# Patient Record
Sex: Female | Born: 1990 | Race: White | Hispanic: No | Marital: Single | Smoking: Never smoker
Health system: Southern US, Community
[De-identification: ages and names within clinical notes are randomized; demographics above are authoritative.]

## PROBLEM LIST (undated history)

## (undated) DIAGNOSIS — M1990 Unspecified osteoarthritis, unspecified site: Secondary | ICD-10-CM

## (undated) HISTORY — DX: Unspecified osteoarthritis, unspecified site: M19.90

---

## 2013-11-30 ENCOUNTER — Ambulatory Visit (INDEPENDENT_AMBULATORY_CARE_PROVIDER_SITE_OTHER): Payer: Managed Care, Other (non HMO) | Admitting: Family Medicine

## 2013-11-30 VITALS — BP 118/70 | HR 82 | Temp 98.2°F | Resp 16 | Ht 68.25 in | Wt 134.8 lb

## 2013-11-30 DIAGNOSIS — M4120 Other idiopathic scoliosis, site unspecified: Secondary | ICD-10-CM

## 2013-11-30 DIAGNOSIS — M419 Scoliosis, unspecified: Secondary | ICD-10-CM

## 2013-11-30 DIAGNOSIS — M539 Dorsopathy, unspecified: Secondary | ICD-10-CM

## 2013-11-30 DIAGNOSIS — M549 Dorsalgia, unspecified: Secondary | ICD-10-CM

## 2013-11-30 NOTE — Patient Instructions (Signed)
Scoliosis  Scoliosis is the name given to a spine that curves sideways. Scoliosis can cause twisting of your shoulders, hips, chest, back, and rib cage.   CAUSES   The cause of scoliosis is not always known. It may be caused by a birth defect or by a disease that can cause muscular dysfunction and imbalance, such as cerebral palsy and muscular dystrophy.   RISK FACTORS  Having a disease that causes muscle disease or dysfunction.  SIGNS AND SYMPTOMS  Scoliosis often has no signs or symptoms. If they are present, they may include:  · Unequal size of one body side compared to the other (asymmetry).  · Visible curvature of the spine.  · Pain. The pain may limit physical activity.  · Shortness of breath.  · Bowel or bladder issues.  DIAGNOSIS  A skilled health care provider will perform an evaluation. This will involve:  · Taking your history.  · Performing a physical examination.  · Performing a neurological exam to detect nerve or muscle function loss.  · Range of motion studies on the spine.  · X-rays.  An MRI may also be obtained.  TREATMENT   Treatment varies depending on the nature, extent, and severity of the disease. If the curvature is not great, you may need only observation. A brace may be used to prevent scoliosis from progressing. A brace may also be needed during growth spurts. Physical therapy may be of benefit. Surgery may be required.   HOME CARE INSTRUCTIONS   · Your health care provider may suggest exercises to strengthen your muscles. Perform them as directed.  · Ask your health care provider before participating in any sports.    · If you have been prescribed an orthopedic brace, wear it as instructed by your health care provider.   SEEK MEDICAL CARE IF:  Your brace causes the skin to become sore (chafe) or is uncomfortable.   SEEK IMMEDIATE MEDICAL CARE IF:  · You have back pain that is not relieved by the medicines prescribed by your health care provider.    · Your legs feel weak or you lose  function in your legs.  · You lose some bowel or bladder control.    Document Released: 03/14/2000 Document Revised: 03/22/2013 Document Reviewed: 11/22/2012  ExitCare® Patient Information ©2015 [employer]. This information is not intended to replace advice given to you by your health care provider. Make sure you discuss any questions you have with your health care provider.

## 2013-11-30 NOTE — Progress Notes (Signed)
Chief Complaint:  Chief Complaint  Patient presents with  . Rib Pain    Left side, x3 months, dull pain, pt. only feels pain when moving around, cough, or laugh  . Back Pain    Pt. states that she sits poorly at work, lower back pain, x3-4 months, dull and achy pain    HPI: Dana Arroyo is a 23 y.o. female who is here for  3-4 month hisotry of dull aching pain on  left side of ribs and also low back pain and also pain in her sternum. She used to be moving all the time, but She switched job in March, she thinks it may be from that. She had had sciatica before and some of the pain is from that. She has no numbness but she does have pain. She She has more dull ache than pain. She has tried tylenol at night. She has constant awareness of it. She has rib pain when she laugh coughs or , with a lot of movement. 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  She has been doing yoga and is only able to do three poses but moderatelyt because it hurts her. NKI, no prior trauma.    History reviewed. No pertinent past medical history. History reviewed. No pertinent past surgical history. History   Social History  . Marital Status: Single    Spouse Name: N/A    Number of Children: N/A  . Years of Education: N/A   Social History Main Topics  . Smoking status: Never Smoker   . Smokeless tobacco: None  . Alcohol Use: No  . Drug Use: No  . Sexual Activity: None   Other Topics Concern  . None   Social History Narrative  . None   Family History  Problem Relation Age of Onset  . Heart disease Maternal Grandfather   . Cancer Paternal Grandmother    No Known Allergies Prior to Admission medications   Medication Sig Start Date End Date Taking? Authorizing Provider  clindamycin (CLEOCIN T) 1 %  lotion Apply topically 2 (two) times daily.   Yes Historical Provider, MD  ISOTRETINOIN PO Take 60 mg by mouth daily.   Yes Historical Provider, MD     ROS: The patient denies fevers, chills, night sweats, unintentional weight loss, chest pain, palpitations, wheezing, dyspnea on exertion, nausea, vomiting, abdominal pain, dysuria, hematuria, melena, numbness, weakness, or tingling.   All other systems have been reviewed and were otherwise negative with the exception of those mentioned in the HPI and as above.    PHYSICAL EXAM: Filed Vitals:   11/30/13 1748  BP: 118/70  Pulse: 82  Temp: 98.2 F (36.8 C)  Resp: 16   Filed Vitals:   11/30/13 1748  Height: 5' 8.25" (1.734 m)  Weight: 134 lb 12.8 oz (61.145 kg)   Body mass index is 20.34 kg/(m^2).  General: Alert, no acute distress HEENT:  Normocephalic, atraumatic, oropharynx patent. EOMI, PERRLA Cardiovascular:  Regular rate and rhythm, no rubs murmurs or gallops.  No Carotid bruits, radial pulse intact. No pedal edema.  Respiratory: Clear to auscultation bilaterally.  No wheezes, rales, or rhonchi.  No cyanosis, no use of accessory musculature GI: No organomegaly, abdomen is soft and non-tender, positive bowel sounds.  No masses. Skin: No rashes. Neurologic: Facial musculature symmetric. Psychiatric: Patient is appropriate throughout our interaction. Lymphatic: No cervical lymphadenopathy Musculoskeletal: Gait intact. + scoliosis, curvature in thoracic spine where she has pain.  + paramsk tenderness on left side , left rib cage and also thoracic spine and mid low back Full ROM 5/5 strength, 2/2 DTRs No saddle anesthesia Hip and knee exam--normal    LABS: No results found for this or any previous visit.   EKG/XRAY:   Primary read interpreted by Dr. Conley Rolls at [facility].   ASSESSMENT/PLAN: Encounter Diagnoses  Name Primary?  . Scoliosis Yes  . Left-sided back pain, unspecified location    Declined xrays Exercises given  for back, advise to look for exercises for scoliosis on internet, I saw there were some when I perused it from PT places, she declined PT unless her back does not improve from home exercises.  Ibuprofen prn F/u prn   Gross sideeffects, risk and benefits, and alternatives of medications d/w patient. Patient is aware that all medications have potential sideeffects and we are unable to predict every sideeffect or drug-drug interaction that may occur.  LE, THAO PHUONG, DO 11/30/2013 7:07 PM

## 2014-02-01 ENCOUNTER — Encounter: Payer: Self-pay | Admitting: Family Medicine

## 2014-02-01 ENCOUNTER — Ambulatory Visit (INDEPENDENT_AMBULATORY_CARE_PROVIDER_SITE_OTHER): Payer: Managed Care, Other (non HMO) | Admitting: Family Medicine

## 2014-02-01 VITALS — BP 120/69 | HR 100 | Temp 98.3°F | Resp 16 | Ht 68.5 in | Wt 131.2 lb

## 2014-02-01 DIAGNOSIS — D509 Iron deficiency anemia, unspecified: Secondary | ICD-10-CM

## 2014-02-01 NOTE — Patient Instructions (Signed)
Iron Deficiency Anemia Anemia is a condition in which there are less red blood cells or hemoglobin in the blood than normal. Hemoglobin is the part of red blood cells that carries oxygen. Iron deficiency anemia is anemia caused by too little iron. It is the most common type of anemia. It may leave you tired and short of breath. CAUSES   Lack of iron in the diet.  Poor absorption of iron, as seen with intestinal disorders.  Intestinal bleeding.  Heavy periods. SIGNS AND SYMPTOMS  Mild anemia may not be noticeable. Symptoms may include:  Fatigue.  Headache.  Pale skin.  Weakness.  Tiredness.  Shortness of breath.  Dizziness.  Cold hands and feet.  Fast or irregular heartbeat. DIAGNOSIS  Diagnosis requires a thorough evaluation and physical exam by your health care provider. Blood tests are generally used to confirm iron deficiency anemia. Additional tests may be done to find the underlying cause of your anemia. These may include:  Testing for blood in the stool (fecal occult blood test).  A procedure to see inside the colon and rectum (colonoscopy).  A procedure to see inside the esophagus and stomach (endoscopy). TREATMENT  Iron deficiency anemia is treated by correcting the cause of the deficiency. Treatment may involve:  Adding iron-rich foods to your diet.  Taking iron supplements. Pregnant or breastfeeding women need to take extra iron because their normal diet usually does not provide the required amount.  Taking vitamins. Vitamin C improves the absorption of iron. Your health care provider may recommend that you take your iron tablets with a glass of orange juice or vitamin C supplement.  Medicines to make heavy menstrual flow lighter.  Surgery. HOME CARE INSTRUCTIONS   Take iron as directed by your health care provider.  If you cannot tolerate taking iron supplements by mouth, talk to your health care provider about taking them through a vein  (intravenously) or an injection into a muscle.  For the best iron absorption, iron supplements should be taken on an empty stomach. If you cannot tolerate them on an empty stomach, you may need to take them with food.  Do not drink milk or take antacids at the same time as your iron supplements. Milk and antacids may interfere with the absorption of iron.  Iron supplements can cause constipation. Make sure to include fiber in your diet to prevent constipation. A stool softener may also be recommended.  Take vitamins as directed by your health care provider.  Eat a diet rich in iron. Foods high in iron include liver, lean beef, whole-grain bread, eggs, dried fruit, and dark green leafy vegetables. SEEK IMMEDIATE MEDICAL CARE IF:   You faint. If this happens, do not drive. Call your local emergency services (911 in [place] if no other help is available.  You have chest pain.  You feel nauseous or vomit.  You have severe or increased shortness of breath with activity.  You feel weak.  You have a rapid heartbeat.  You have unexplained sweating.  You become light-headed when getting up from a chair or bed. MAKE SURE YOU:   Understand these instructions.  Will watch your condition.  Will get help right away if you are not doing well or get worse. Document Released: 03/14/2000 Document Revised: 03/22/2013 Document Reviewed: 11/22/2012 ExitCare Patient Information 2015 [employer]. This information is not intended to replace advice given to you by your health care provider. Make sure you discuss any questions you have with your health care provider.  

## 2014-02-01 NOTE — Progress Notes (Signed)
   Subjective:    Patient ID: Dana Arroyo, female    DOB: 08-15-1990, 23 y.o.   MRN: [id number]  HPI This is a very pleasant 23 yo female who presents today with concern of anemia. She is currently taking Accutane for her cystic, facial acne and had her routine labs drawn at her dermatologist's office last week. She was advised to see her PCP because her blood count was low.   She has been a vegetarian for about 10 years, she does eat eggs and dairy. She has regular menstrual cycles that last 5-6 days. The first 2 days are heavy and she goes through 5-6 tampons. The remaining 4-5 days are lighter. She thinks she has an anal fissure, she has had pain with bowel movements for the last 4-6 weeks. She had some occasional blood with wiping. This is getting better and she has pain 2x week with BM. She does not wish to have this looked at today. She feels it is resolving.   Her back pain from previous visit is improved. She sits on a stability ball for at least 4 hours a day.   Review of Systems No chest pain, no SOB, no palpitations, no dizziness or weakness. No paresthesias. No dark stool, no blood on stool or in toilet.     Objective:   Physical Exam  Constitutional: She is oriented to person, place, and time. She appears well-developed and well-nourished.  HENT:  Head: Normocephalic and atraumatic.  Eyes: Conjunctivae are normal.  Neck: Normal range of motion. Neck supple.  Cardiovascular: Normal rate.   Pulmonary/Chest: Effort normal.  Musculoskeletal: Normal range of motion.  Neurological: She is alert and oriented to person, place, and time.  Skin: Skin is warm and dry.  Psychiatric: She has a normal mood and affect. Her behavior is normal. Judgment and thought content normal.  Vitals reviewed.  BP 120/69 mmHg  Pulse 100  Temp(Src) 98.3 F (36.8 C) (Oral)  Resp 16  Ht 5' 8.5" (1.74 m)  Wt 131 lb 3.2 oz (59.512 kg)  BMI 19.66 kg/m2  SpO2 100%  LMP 01/26/2014  Outside labs from  patient's dermatologist- WBC 6.1 RBC 4.05 Hgb 11.2 Hct  33.2 MCV 82    Assessment & Plan:  1. Iron deficiency anemia - IBC panel   Emi Belfasteborah B. Gessner, FNP-BC  [facility]  02/02/2014 9:23 PM

## 2014-02-02 LAB — IBC PANEL
%SAT: 10 % — AB (ref 20–55)
TIBC: 315 ug/dL (ref 250–470)
UIBC: 285 ug/dL (ref 125–400)

## 2014-02-02 LAB — IRON: Iron: 30 ug/dL — ABNORMAL LOW (ref 42–145)

## 2014-02-06 ENCOUNTER — Other Ambulatory Visit: Payer: Self-pay | Admitting: Family Medicine

## 2014-02-06 DIAGNOSIS — D509 Iron deficiency anemia, unspecified: Secondary | ICD-10-CM

## 2014-02-06 MED ORDER — FERROUS SULFATE 325 (65 FE) MG PO TABS: 325.0000 mg | ORAL_TABLET | Freq: Two times a day (BID) | ORAL | Status: DC

## 2014-03-02 ENCOUNTER — Telehealth: Payer: Self-pay

## 2014-03-02 NOTE — Telephone Encounter (Signed)
Patient went to Dermatologist her blood work is lower than before.  Wants a call back regarding iron.     Taking medication as prescribed by D. Leone PayorGessner for the past three weeks.    Faxed over labs from dermatologist.   [phone]

## 2014-03-02 NOTE — Telephone Encounter (Signed)
Left message for her to call me back regarding lab values.

## 2014-03-02 NOTE — Telephone Encounter (Signed)
Spoke with patient. She is going to email her labs and I'll look for fax from derm and call her back tomorrow.

## 2014-03-02 NOTE — Telephone Encounter (Signed)
Debbie,  Would you like her to return to the clinic? I tried tracking down the fax from dermatologist with lab results, but was un-successful.

## 2014-03-03 NOTE — Telephone Encounter (Signed)
Attempted to call patient in regards to labs. Her VM box was full. I do not have an email from her and the fax from her dermatologist has not been found in the office. Will try again tomorrow.

## 2014-03-06 ENCOUNTER — Telehealth: Payer: Self-pay

## 2014-03-06 NOTE — Telephone Encounter (Signed)
Pt. Tried to call back to return a missed call. Told her that someone would try here again tomorrow. She stated that would be fine.

## 2014-03-07 NOTE — Telephone Encounter (Signed)
Called patient and discussed labs. Labs from derm 11/21/2013- Hgb/Hct- 12.3/37.7, 9/24- Hgb/Hct- 11.5/36.2, 10/28 Hgb/Hct- 11.2/33.2, 11/30- Hgb/Hct 10.3/32.5. Advised patient to stop isotretinoin and have CBC at end of the month. She has 23 days left of her treatment and is going to call her dermatologist to discuss the ramifications of ceasing treatment. She is scheduled to have labs at derm on 12/30. She will send me the results. Discussed her persistent back pain and she will make an appointment to come in for an Xray and further evaluation.

## 2014-03-07 NOTE — Telephone Encounter (Signed)
Debbie GessnDeboraha Spranger has been trying to call the pt regarding a fax from her dermatologist and e-mail with her labs. I will talk to Eunice BlaseDebbie today and see what she would like to do before calling pt back.

## 2014-05-30 ENCOUNTER — Other Ambulatory Visit: Payer: Self-pay | Admitting: Family Medicine

## 2015-01-08 ENCOUNTER — Ambulatory Visit (INDEPENDENT_AMBULATORY_CARE_PROVIDER_SITE_OTHER): Payer: Managed Care, Other (non HMO)

## 2015-01-08 ENCOUNTER — Encounter: Payer: Self-pay | Admitting: Physician Assistant

## 2015-01-08 ENCOUNTER — Ambulatory Visit (INDEPENDENT_AMBULATORY_CARE_PROVIDER_SITE_OTHER): Payer: Managed Care, Other (non HMO) | Admitting: Physician Assistant

## 2015-01-08 VITALS — BP 120/76 | HR 105 | Temp 98.5°F | Resp 16 | Ht 68.5 in | Wt 151.4 lb

## 2015-01-08 DIAGNOSIS — M25551 Pain in right hip: Secondary | ICD-10-CM

## 2015-01-08 DIAGNOSIS — N912 Amenorrhea, unspecified: Secondary | ICD-10-CM | POA: Diagnosis not present

## 2015-01-08 LAB — POCT URINE PREGNANCY: PREG TEST UR: NEGATIVE

## 2015-01-08 MED ORDER — TRAMADOL-ACETAMINOPHEN 37.5-325 MG PO TABS: 1.0000 | ORAL_TABLET | Freq: Four times a day (QID) | ORAL | Status: DC | PRN

## 2015-01-08 MED ORDER — PREDNISONE 20 MG PO TABS
ORAL_TABLET | ORAL | Status: DC
Start: 2015-01-08 — End: 2016-02-19

## 2015-01-08 NOTE — Progress Notes (Signed)
01/08/2015 at 9:17 PM  Dana Arroyo / DOB: June 16, 1990 / MRN: [id number]  The patient  does not have a problem list on file.  SUBJECTIVE  Dana Arroyo is a 24 y.o. well appearing female presenting for the chief complaint of right sided hip pain that started 2 weeks ago and is worsening. She describes the pain as a severe dull ache which is worse with ambulation.  She has tried ibuprofen 800 tid for the pain without relief.  She feels the problem is getting worse.  Has a history of low back pain that is not bothering her today.     She  has no past medical history on file.    Medications reviewed and updated by myself where necessary, and exist elsewhere in the encounter.   Dana Arroyo has No Known Allergies. She  reports that she has never smoked. She does not have any smokeless tobacco history on file. She reports that she does not drink alcohol or use illicit drugs. She  has no sexual activity history on file. The patient  has no past surgical history on file.  Her family history includes Cancer in her paternal grandmother; Heart disease in her maternal grandfather.  Review of Systems  Constitutional: Negative for fever and chills.  Respiratory: Negative for shortness of breath.   Cardiovascular: Negative for chest pain.  Gastrointestinal: Negative for nausea and abdominal pain.  Genitourinary: Negative.   Skin: Negative for rash.  Neurological: Negative for dizziness and headaches.    OBJECTIVE  Her  height is 5' 8.5" (1.74 m) and weight is 151 lb 6.4 oz (68.675 kg). Her oral temperature is 98.5 F (36.9 C). Her blood pressure is 120/76 and her pulse is 105. Her respiration is 16.  The patient's body mass index is 22.68 kg/(m^2).  Pulse rechecked by me at 90 bpm.   Physical Exam  Constitutional: She is oriented to person, place, and time. She appears well-developed and well-nourished. No distress.  Eyes: Pupils are equal, round, and reactive to light.  Cardiovascular: Normal rate.     Respiratory: Effort normal. No respiratory distress.  GI: She exhibits no distension.  Musculoskeletal:       Right hip: She exhibits decreased range of motion. She exhibits normal strength, no tenderness, no bony tenderness and no swelling.       Left hip: Normal.       Legs: Neurological: She is alert and oriented to person, place, and time.  Skin: Skin is warm and dry. She is not diaphoretic.  Psychiatric: She has a normal mood and affect. Her behavior is normal. Judgment and thought content normal.    [facility] reading (PRIMARY) by  Dr. Katrinka Blazing: Minimal right sided joint space narrowing other wise normal.    Results for orders placed or performed in visit on 01/08/15 (from the past 24 hour(s))  POCT urine pregnancy     Status: None   Collection Time: 01/08/15  5:32 PM  Result Value Ref Range   Preg Test, Ur Negative Negative    ASSESSMENT & PLAN  Dana Arroyo was seen today for hip pain.  Diagnoses and all orders for this visit:  Right hip pain: Mild joint space narrowing noted.  Radiologist recommending follow up with MRI if no improvement with below.  Patient advised of this plan and is amenable. Will pursue rheumatology work up if CRP and Sed rate are elevated.  -     DG HIPS BILAT W OR W/O PELVIS 2V; Future -  Sedimentation Rate -     C-reactive protein -     predniSONE (DELTASONE) 20 MG tablet; Take 3 PO QAM x3days, 2 PO QAM x3days, 1 PO QAM x3days -     traMADol-acetaminophen (ULTRACET) 37.5-325 MG tablet; Take 1 tablet by mouth every 6 (six) hours as needed.     The patient was advised to call or come back to clinic if she does not see an improvement in symptoms, or worsens with the above plan.   Dana Arroyo, MHS, PA-C [facility] 01/08/2015 9:17 PM

## 2015-01-09 LAB — C-REACTIVE PROTEIN: CRP: 1.1 mg/dL — ABNORMAL HIGH (ref <0.60)

## 2015-01-10 ENCOUNTER — Telehealth: Payer: Self-pay | Admitting: Physician Assistant

## 2015-01-10 ENCOUNTER — Other Ambulatory Visit: Payer: Self-pay | Admitting: Physician Assistant

## 2015-01-10 DIAGNOSIS — R7982 Elevated C-reactive protein (CRP): Secondary | ICD-10-CM

## 2015-01-10 DIAGNOSIS — M25551 Pain in right hip: Secondary | ICD-10-CM

## 2015-01-10 DIAGNOSIS — R700 Elevated erythrocyte sedimentation rate: Secondary | ICD-10-CM

## 2015-01-10 LAB — SEDIMENTATION RATE: SED RATE: 32 mm/h — AB (ref 0–20)

## 2015-01-10 NOTE — Telephone Encounter (Signed)
Called and spoke with pt and she stated that she was given prednisone taper and she started on 01/09/15.  She stated that she was confused by the instructions and ended up taking 3 days worth on 01/09/15 and is now not sure how to proceed with the rest of the RX.  Casimiro NeedleMichael please advise. thanks

## 2015-01-10 NOTE — Telephone Encounter (Signed)
Addressed.  Patient feeling fine.  Poison control contacted and advised that serious side effects unlikely.  Deliah BostonMichael Clark, MS, PA-C   3:25 PM, 01/10/2015

## 2015-01-10 NOTE — Telephone Encounter (Signed)
Patient states that when she took Prednisone, she misunderstood the directions and she took 3 days worth of pills all in one day. She states that she is feeling fine however she still wants some advice about how to proceed.  [phone]

## 2015-01-10 NOTE — Progress Notes (Signed)
Patient reporting she accidentally to 180 mg of prednisone on the evening .  Reports that she feels fine at this time.  Poison control contacted and advised that this may cause nausea and emesis, however no serious side effects should occur with PO dosing.  Deliah BostonMichael Andrew Blasius, MS, PA-C   2:05 PM, 01/10/2015

## 2015-02-03 ENCOUNTER — Other Ambulatory Visit: Payer: Self-pay | Admitting: Family Medicine

## 2015-03-11 ENCOUNTER — Other Ambulatory Visit: Payer: Self-pay | Admitting: Family Medicine

## 2015-03-20 ENCOUNTER — Other Ambulatory Visit: Payer: Self-pay | Admitting: Rheumatology

## 2015-03-20 DIAGNOSIS — M25551 Pain in right hip: Secondary | ICD-10-CM

## 2015-03-28 ENCOUNTER — Ambulatory Visit
Admission: RE | Admit: 2015-03-28 | Discharge: 2015-03-28 | Disposition: A | Discharge status: Home / Self Care | Payer: Managed Care, Other (non HMO) | Source: Ambulatory Visit | Attending: Rheumatology | Admitting: Rheumatology

## 2015-03-28 DIAGNOSIS — M25551 Pain in right hip: Secondary | ICD-10-CM

## 2016-02-19 ENCOUNTER — Ambulatory Visit (INDEPENDENT_AMBULATORY_CARE_PROVIDER_SITE_OTHER): Payer: Managed Care, Other (non HMO) | Admitting: Physician Assistant

## 2016-02-19 VITALS — BP 128/68 | HR 103 | Temp 98.1°F | Resp 16 | Ht 68.0 in | Wt 169.0 lb

## 2016-02-19 DIAGNOSIS — B349 Viral infection, unspecified: Secondary | ICD-10-CM | POA: Diagnosis not present

## 2016-02-19 DIAGNOSIS — J3489 Other specified disorders of nose and nasal sinuses: Secondary | ICD-10-CM | POA: Diagnosis not present

## 2016-02-19 MED ORDER — IPRATROPIUM BROMIDE 0.03 % NA SOLN: 2.0000 | Freq: Two times a day (BID) | NASAL | 0 refills | Status: DC

## 2016-02-19 NOTE — Progress Notes (Signed)
   Dana Arroyo  MRN: [id number] DOB: 1991-02-14  PCP: No PCP Per Patient  Subjective:  Pt is a 25 year old female who presents to clinic for sinus problem x 2 weeks.  She endorses difficulty breathing at night. C/o inflammation in nose and sinus pressure.  Has not taken anything to help. Denies cough, SOB, wheezing, chest congestion, chest pain, palpitations, nausea, vomiting.   Inflammatory arthritis right hop. Taking methotrexate. Recent labs revealed elevated LFTs. Has been taking x 10 months. Next week f/u appointment to discuss new medication Embrol (biologic)    Review of Systems  Constitutional: Negative for chills, diaphoresis, fatigue and fever.  HENT: Positive for congestion and sinus pressure. Negative for ear discharge, ear pain, postnasal drip, rhinorrhea, sneezing, sore throat and trouble swallowing.   Respiratory: Negative for cough, chest tightness, shortness of breath and wheezing.   Cardiovascular: Negative for chest pain and palpitations.  Gastrointestinal: Negative for abdominal pain, diarrhea, nausea and vomiting.  Neurological: Negative for dizziness, weakness, light-headedness and headaches.  Psychiatric/Behavioral: Negative for sleep disturbance.    There are no active problems to display for this patient.   Current Outpatient Prescriptions on File Prior to Visit  Medication Sig Dispense Refill  . ferrous sulfate 325 (65 FE) MG tablet Take 1 tablet (325 mg total) by mouth 2 (two) times daily with a meal. NO MORE REFILLS WITHOUT OFFICE VISIT/ LABS - 2ND NOTICE 30 tablet 0  . ibuprofen (ADVIL,MOTRIN) 200 MG tablet Take 200 mg by mouth every 6 (six) hours as needed.     No current facility-administered medications on file prior to visit.     No Known Allergies   Objective:  BP 128/68 (BP Location: Right Arm, Patient Position: Sitting, Cuff Size: Normal)   Pulse (!) 103   Temp 98.1 F (36.7 C) (Oral)   Resp 16   Ht 5\' 8"  (1.727 m)   Wt 169 lb (76.7 kg)    LMP 01/22/2016 (Approximate)   SpO2 98%   BMI 25.70 kg/m   Physical Exam  Constitutional: She is oriented to person, place, and time and well-developed, well-nourished, and in no distress. No distress.  HENT:  Right Ear: Tympanic membrane normal.  Left Ear: Tympanic membrane normal.  Nose: Mucosal edema present. No rhinorrhea. Right sinus exhibits no maxillary sinus tenderness and no frontal sinus tenderness. Left sinus exhibits no maxillary sinus tenderness and no frontal sinus tenderness.  Mouth/Throat: Posterior oropharyngeal edema present. No oropharyngeal exudate or posterior oropharyngeal erythema.  Cardiovascular: Normal rate, regular rhythm and normal heart sounds.   Pulmonary/Chest: Effort normal. No respiratory distress.  Neurological: She is alert and oriented to person, place, and time. GCS score is 15.  Skin: Skin is warm and dry.  Psychiatric: Mood, memory, affect and judgment normal.  Vitals reviewed.   Assessment and Plan :  1. Sinus pain 2. Viral illness - ipratropium (ATROVENT) 0.03 % nasal spray; Place 2 sprays into both nostrils 2 (two) times daily.  Dispense: 30 mL; Refill: 0 - Supportive care: Encouraged patient to use neti-pot and hot steamy showers to help clear mucus. Stay well hydrated. RTC in 5-7 days if no improvement.    Marco CollieWhitney Karleen Seebeck, PA-C  [facility] 02/19/2016 3:40 PM

## 2016-02-19 NOTE — Patient Instructions (Addendum)
Try a netti-pot and nose spray. If not better next week, call and i'll rx antibiotics. Hot steamy showers to help clear mucus. Drink plenty of fluids.    Thank you for coming in today. I hope you feel we met your needs.  Feel free to call [facility] if you have any questions or further requests.  Please consider signing up for MyChart if you do not already have it, as this is a great way to communicate with me.  Best,  Whitney McVey, PA-C     IF you received an x-ray today, you will receive an invoice from [facility]. Please contact [facility] at [phone] with questions or concerns regarding your invoice.   IF you received labwork today, you will receive an invoice from [employer]. Please contact [facility] at [phone] with questions or concerns regarding your invoice.   Our billing staff will not be able to assist you with questions regarding bills from these companies.  You will be contacted with the lab results as soon as they are available. The fastest way to get your results is to activate your My Chart account. Instructions are located on the last page of this paperwork. If you have not heard from [place] regarding the results in 2 weeks, please contact this office.

## 2016-03-12 ENCOUNTER — Other Ambulatory Visit: Payer: Self-pay | Admitting: Physician Assistant

## 2016-03-12 DIAGNOSIS — J3489 Other specified disorders of nose and nasal sinuses: Secondary | ICD-10-CM

## 2016-05-09 DIAGNOSIS — M1990 Unspecified osteoarthritis, unspecified site: Secondary | ICD-10-CM | POA: Insufficient documentation

## 2016-10-06 IMAGING — CR DG HIP (WITH OR WITHOUT PELVIS) 2V BILAT
3 series · 3 of 3 positions shown · non-contrast
Comparison: None

CLINICAL DATA: Right hip pain for 1 and half weeks. No known
injury.

EXAM:
DG HIP (WITH OR WITHOUT PELVIS) 2V BILAT

[lateral (1 of 2)]
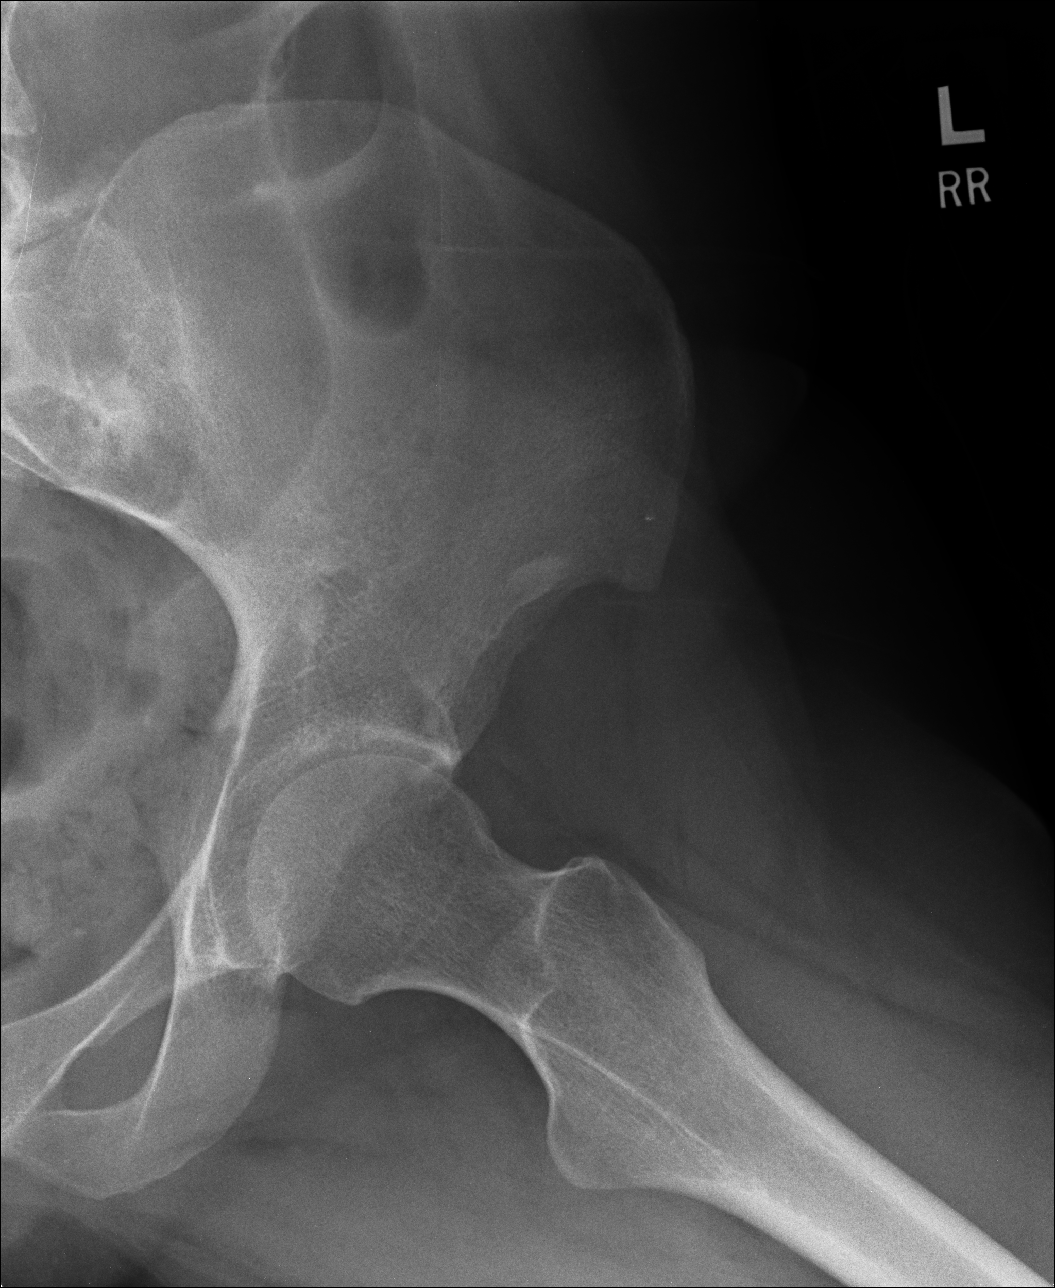

[AP]
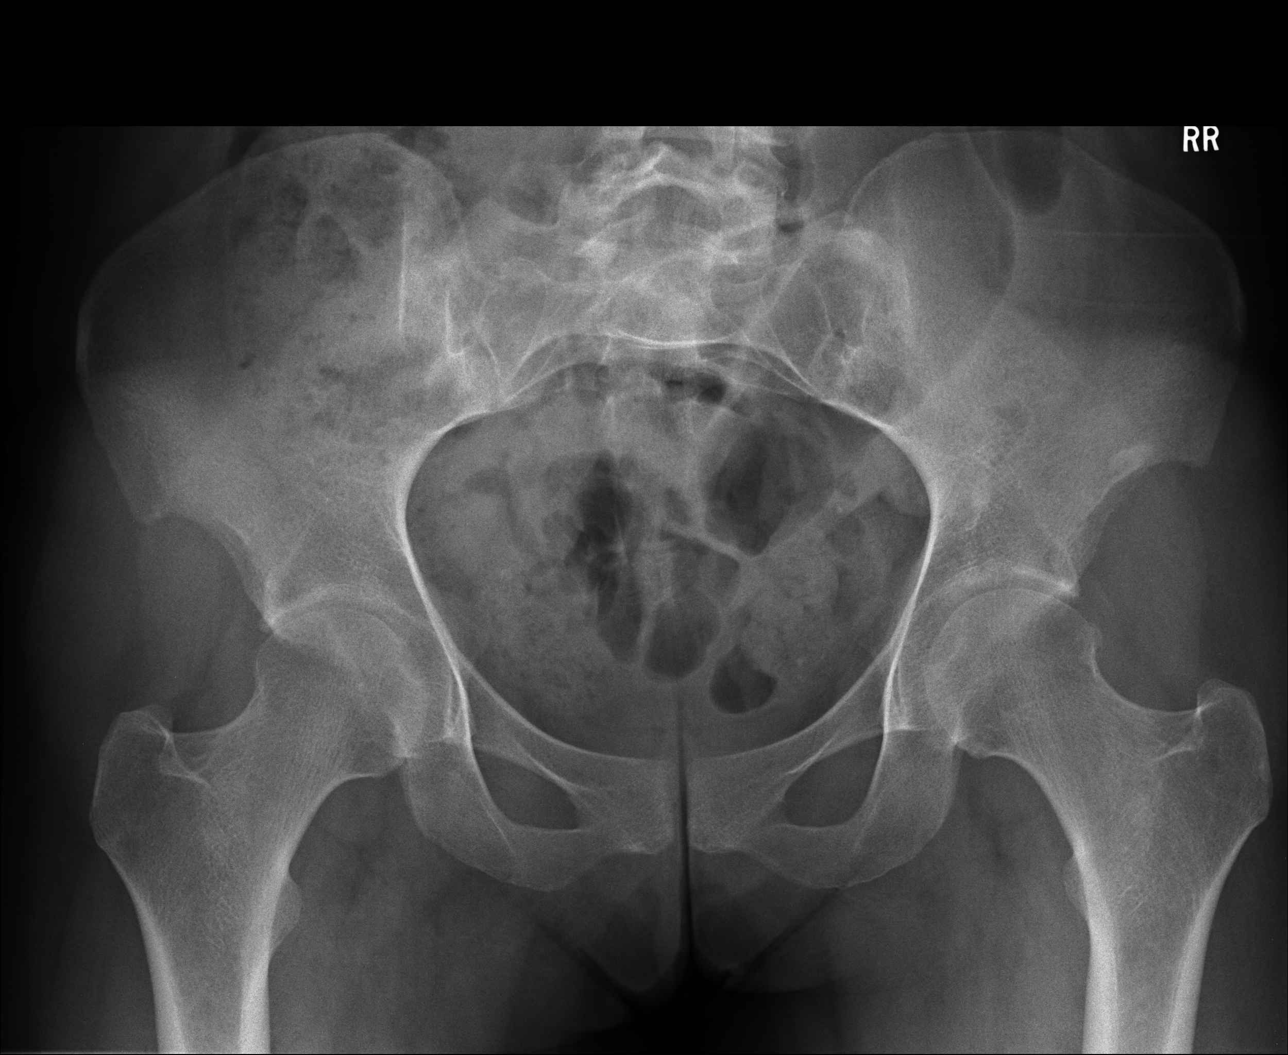

[lateral (2 of 2)]
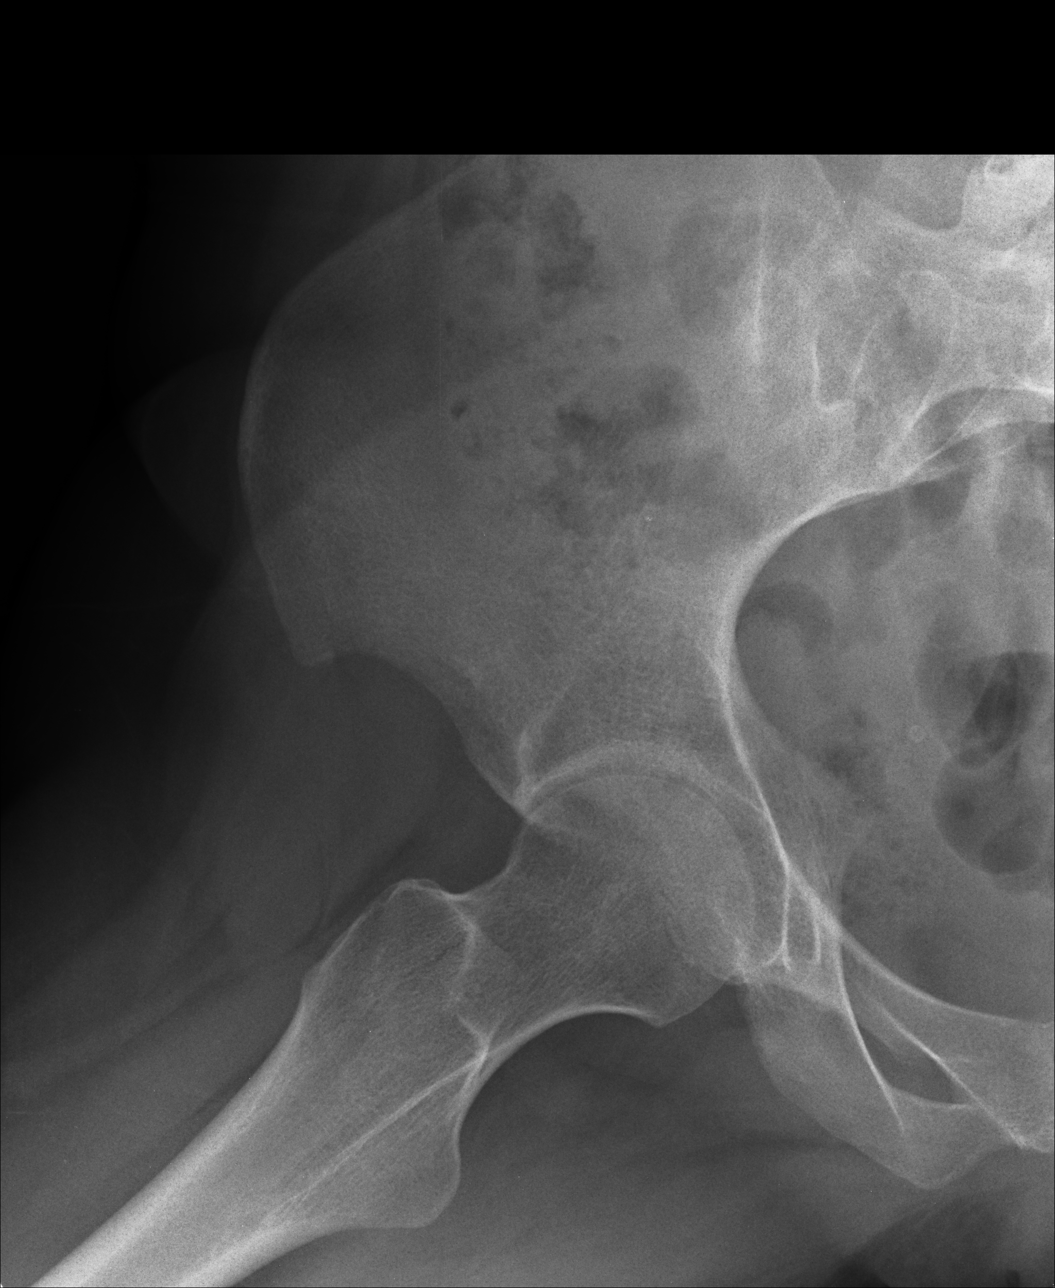

[3 of 3 positions shown; findings below may reference images not displayed]

FINDINGS: There is mild superior joint space narrowing on the right. No other
arthropathic change. Minor marginal spurring is seen from the base
of the left femoral head without joint space narrowing on the left.

No fracture. No bone lesion. SI joints and symphysis pubis are
normally spaced and aligned. Soft tissues are unremarkable.
IMPRESSION: No fracture or bone lesion. No acute finding. Mild superior hip
joint space narrowing on the right. Questionable etiology given this
patient's young age. Consider follow-up right hip MRI into evaluate
hip cartilage loss.

## 2016-12-24 IMAGING — MR MR PELVIS W/O CM
4 of 5 series · 25 of 48 positions shown · non-contrast
Comparison: Radiographs 01/08/2015

CLINICAL DATA: Right hip pain for approximately 3 months.

EXAM:
MRI PELVIS WITHOUT CONTRAST
TECHNIQUE: Multiplanar multisequence MR imaging of the pelvis was performed. No
intravenous contrast was administered.

[Series 8: STIR · coronal · right · 3.0mm · 1.25mm/px · 7 of 25 slices shown]
[im 1/25]
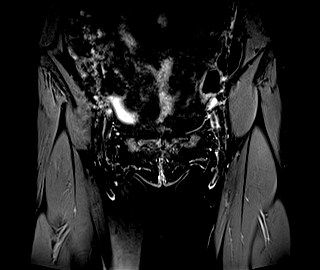
[im 5/25]
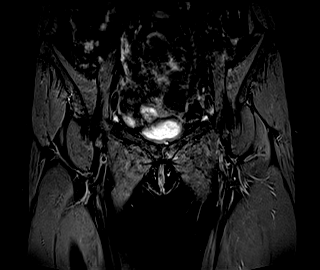
[im 9/25]
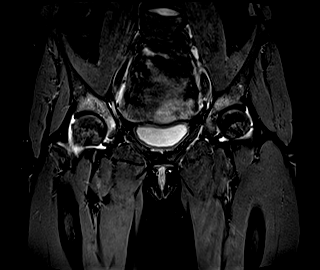
[im 13/25]
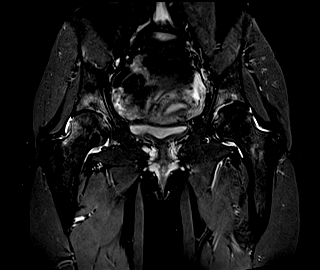
[im 17/25]
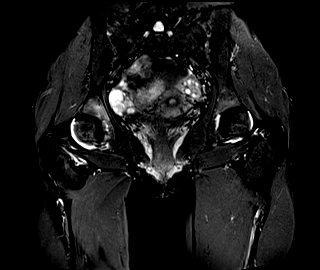
[im 21/25]
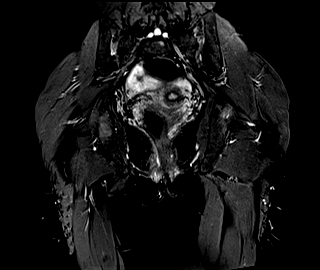
[im 25/25]
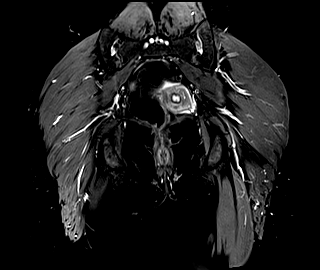

[Series 9: T1 · coronal · right · 3.0mm · 0.78mm/px · 8 of 25 slices shown (1 of 2)]
[im 1/25]
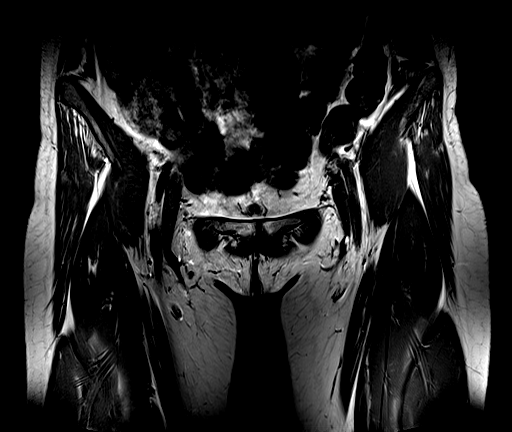
[im 4/25]
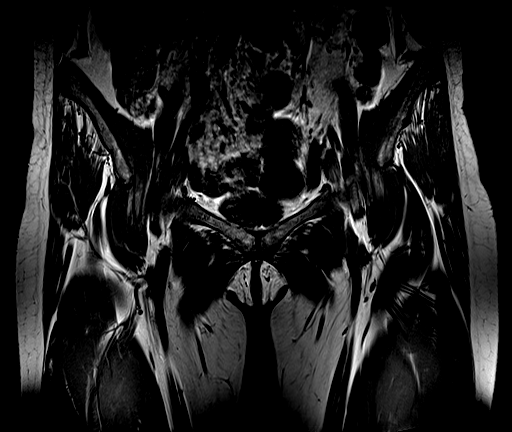
[im 7/25]
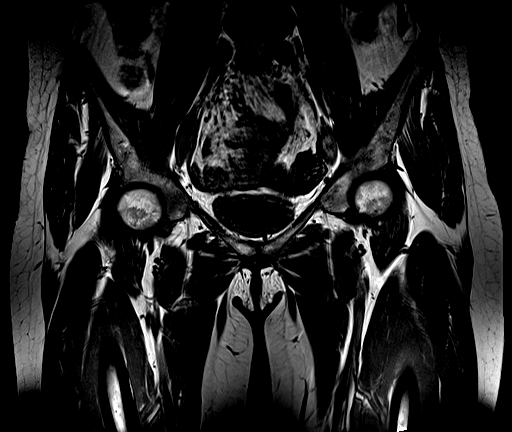
[im 11/25]
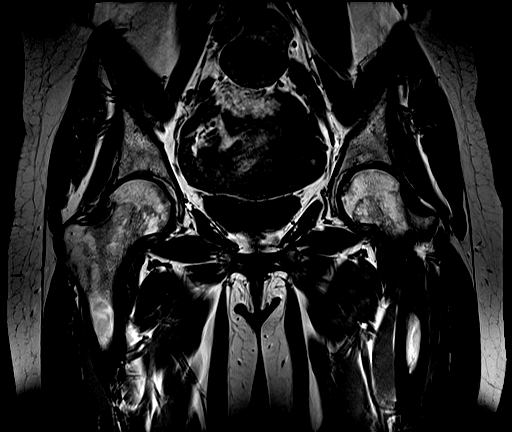
[im 14/25]
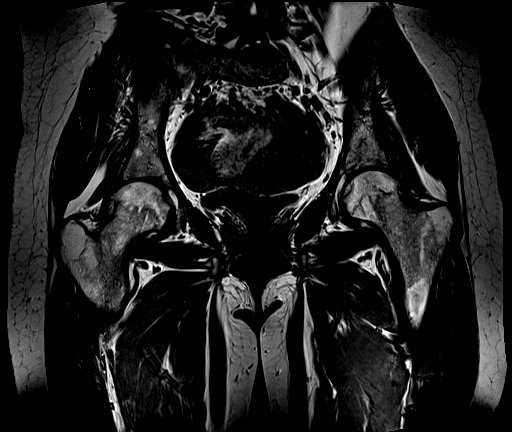
[im 18/25]
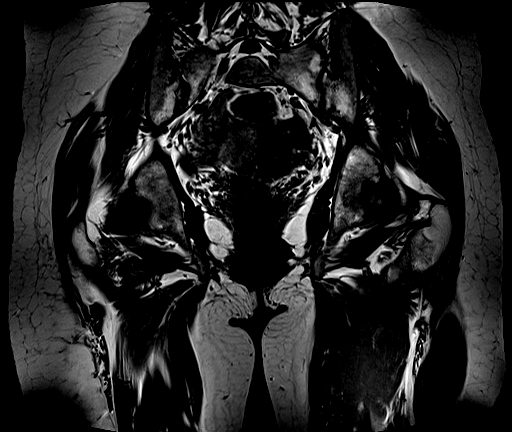
[im 21/25]
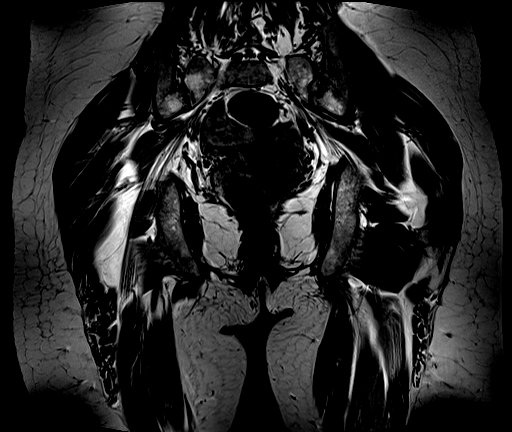
[im 25/25]
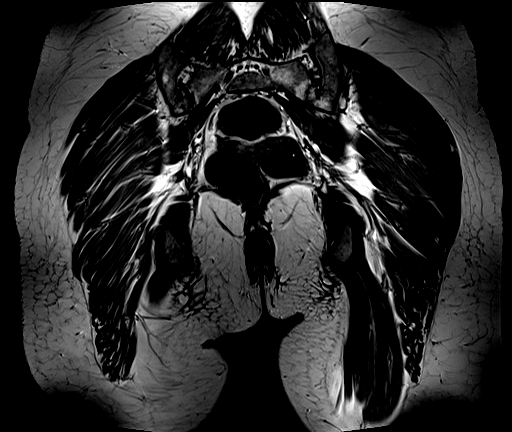

[Series 10: T1 · axial · right · 5.0mm · 0.78mm/px · z∈[-8,+167]mm · 7 of 30 slices shown (2 of 2)]
[im 1/30]
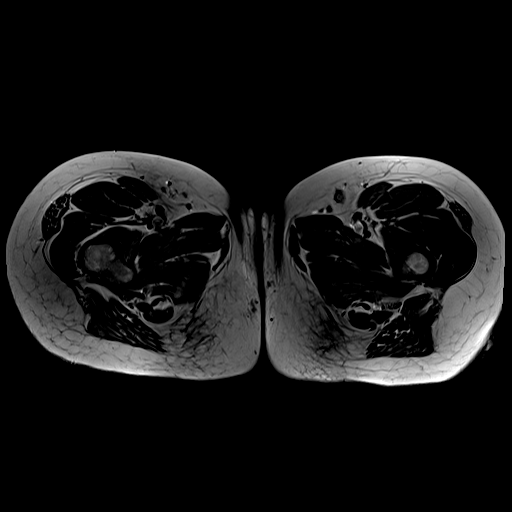
[im 4/30]
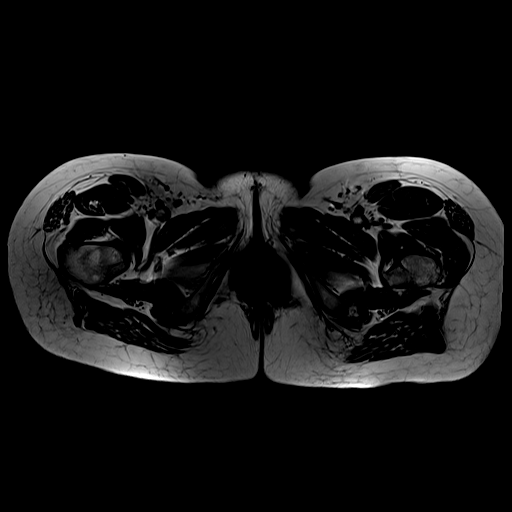
[im 8/30]
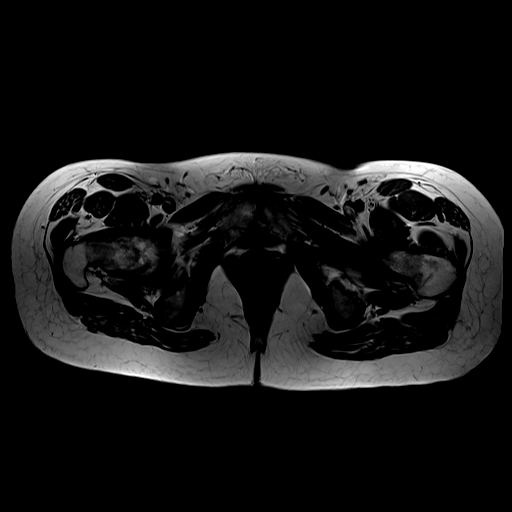
[im 11/30]
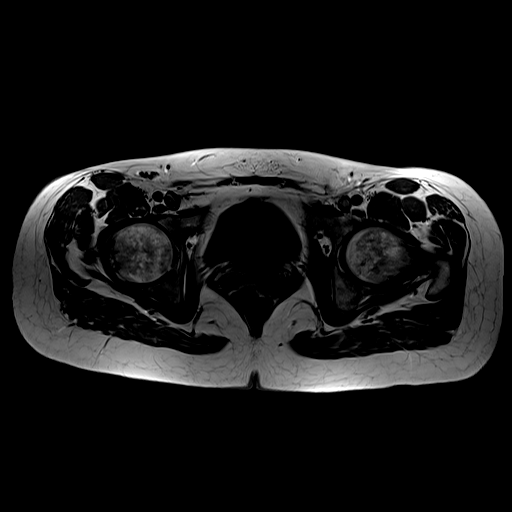
[im 15/30]
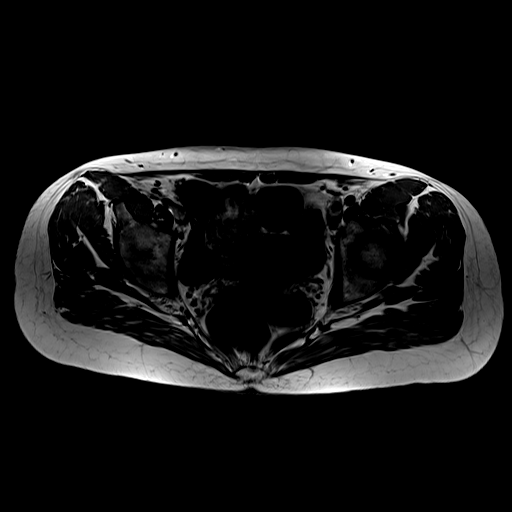
[im 19/30]
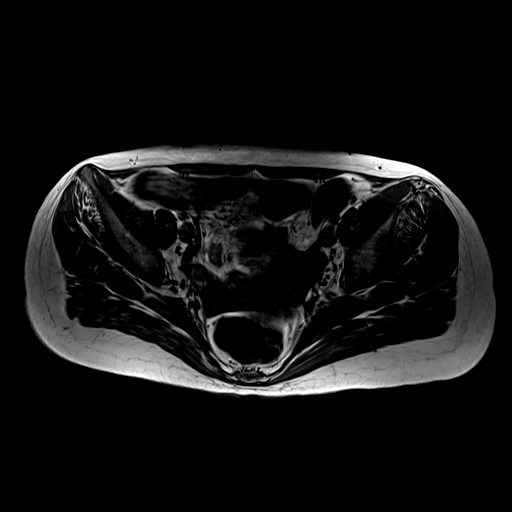
[im 26/30]
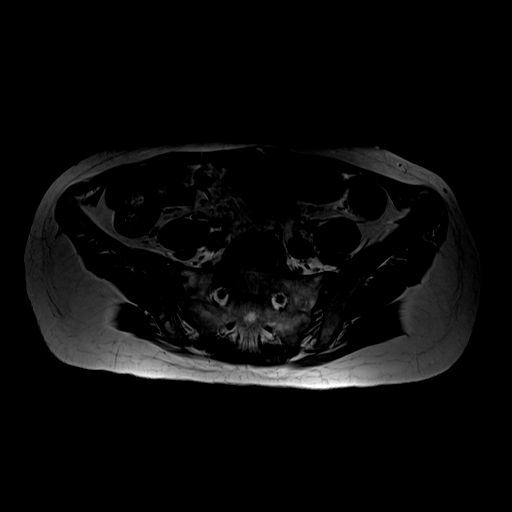

[Series 11: T2 fat-sat · axial · right · 5.0mm · 0.78mm/px · z∈[+13,+167]mm · 3 of 30 slices shown]
[im 4/30]
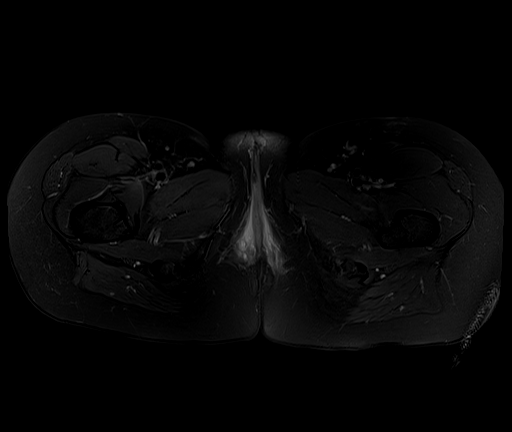
[im 15/30]
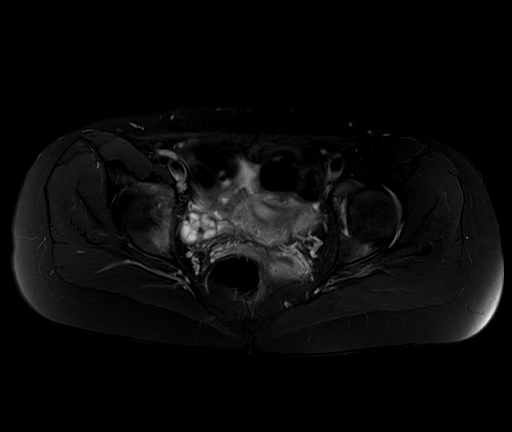
[im 26/30]
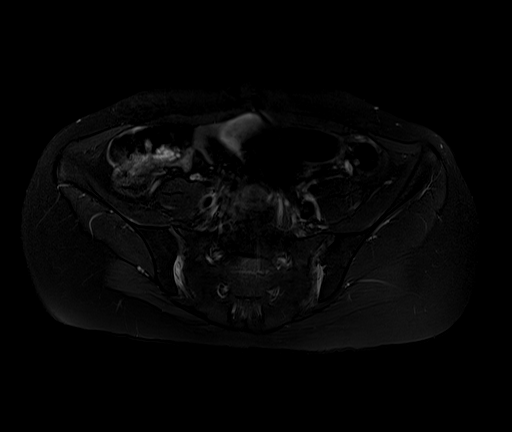

[25 of 48 positions shown; findings below may reference images not displayed]

FINDINGS: Bilateral and fairly symmetric arthropathic changes involving both
hips. There is advanced joint space narrowing for the patient's age,
subchondral cystic change or erosions, bilateral joint effusions and
moderate subchondral stress edema. No obvious loose bodies. Moderate
degenerative chondrosis.

Pubic symphysis and SI joint arthropathic changes are also noted.
The SI joints demonstrate moderate sized effusions, cartilage loss
and possible small erosions.

Findings are highly suspicious for an inflammatory arthropathy such
as rheumatoid arthritis or ankylosing spondylitis.

No significant intra pelvic abnormalities are demonstrated.
IMPRESSION: 1. MR findings consistent with an inflammatory arthropathy involving
both hips, the SI joints and pubic symphysis. Given the symmetry of
this process I would favor rheumatoid arthritis or ankylosing
spondylitis.
2. No significant intrapelvic abnormalities.

## 2017-04-10 ENCOUNTER — Ambulatory Visit (INDEPENDENT_AMBULATORY_CARE_PROVIDER_SITE_OTHER): Payer: Managed Care, Other (non HMO) | Admitting: Physician Assistant

## 2017-04-10 ENCOUNTER — Other Ambulatory Visit: Payer: Self-pay

## 2017-04-10 ENCOUNTER — Encounter: Payer: Self-pay | Admitting: Physician Assistant

## 2017-04-10 VITALS — BP 106/80 | HR 127 | Temp 98.4°F | Resp 16 | Ht 68.5 in | Wt 170.2 lb

## 2017-04-10 DIAGNOSIS — Z01419 Encounter for gynecological examination (general) (routine) without abnormal findings: Secondary | ICD-10-CM

## 2017-04-10 DIAGNOSIS — D509 Iron deficiency anemia, unspecified: Secondary | ICD-10-CM | POA: Insufficient documentation

## 2017-04-10 DIAGNOSIS — L739 Follicular disorder, unspecified: Secondary | ICD-10-CM | POA: Insufficient documentation

## 2017-04-10 DIAGNOSIS — Z13220 Encounter for screening for lipoid disorders: Secondary | ICD-10-CM

## 2017-04-10 DIAGNOSIS — Z1329 Encounter for screening for other suspected endocrine disorder: Secondary | ICD-10-CM

## 2017-04-10 DIAGNOSIS — Z130 Encounter for screening for diseases of the blood and blood-forming organs and certain disorders involving the immune mechanism: Secondary | ICD-10-CM

## 2017-04-10 DIAGNOSIS — Z0000 Encounter for general adult medical examination without abnormal findings: Secondary | ICD-10-CM

## 2017-04-10 DIAGNOSIS — Z124 Encounter for screening for malignant neoplasm of cervix: Secondary | ICD-10-CM | POA: Diagnosis not present

## 2017-04-10 LAB — POCT URINALYSIS DIP (MANUAL ENTRY)
Bilirubin, UA: NEGATIVE
Glucose, UA: NEGATIVE mg/dL
Ketones, POC UA: NEGATIVE mg/dL
Leukocytes, UA: NEGATIVE
Nitrite, UA: NEGATIVE
Protein Ur, POC: NEGATIVE mg/dL
Spec Grav, UA: >=1.03 — AB (ref 1.010–1.025)
Urobilinogen, UA: 0.2 E.U./dL
pH, UA: 5 (ref 5.0–8.0)

## 2017-04-10 MED ORDER — CLINDAMYCIN PHOSPHATE 1 % EX GEL: Freq: Two times a day (BID) | CUTANEOUS | 0 refills | Status: DC

## 2017-04-10 MED ORDER — MUPIROCIN 2 % EX OINT: 1.0000 "application " | TOPICAL_OINTMENT | Freq: Three times a day (TID) | CUTANEOUS | 1 refills | Status: DC

## 2017-04-10 NOTE — Progress Notes (Signed)
[facility] at [place], [place] [address] [phone]  Date:  04/10/2017   Name:  Dana Arroyo   DOB:  July 15, 1990   MRN:  [id number]  PCP:  Patient, No Pcp Per    Chief Complaint: Annual Exam (w/pap)   History of Present Illness:  This is a 27 y.o. female with PMH scoliosis and iron deficiency anemia who is presenting for CPE. Advice worker.   Iron deficiency anemia - takes iron supplement daily. Has not had labs done in a while.   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 Takes Enbrel 23m once weekly. Stopped Methotrexate bc it wasn't working well for her   Complaints: bumps near vagina.  LMP: regular q 5 weeks.  Contraception: none Last pap: due  Sexual history: active with women only x 8 years.   Immunizations: unknown Eye: 20/15 Diet/Exercise: vegetarian x 13 years, she does eat eggs and dairy. No exercise.  Fam hx: MGM heart disease; PGM breast cancer  Tobacco/alcohol/substance use: never smoker, occasional alcohol use, no drug use.   Review of Systems:  Review of Systems  Constitutional: Negative for chills, diaphoresis, fatigue and fever.  HENT: Negative for congestion, postnasal drip, rhinorrhea, sinus pressure, sneezing and sore throat.   Respiratory: Negative for cough, chest tightness, shortness of breath and wheezing.   Cardiovascular: Negative for chest pain and palpitations.  Gastrointestinal: Negative for abdominal pain, diarrhea, nausea and vomiting.  Genitourinary: Positive for genital sores. Negative for decreased urine volume, difficulty urinating, dysuria, enuresis, flank pain, frequency, hematuria and urgency.  Musculoskeletal: Negative for back pain.  Neurological: Negative for dizziness, weakness, light-headedness and headaches.    Patient Active Problem List   Diagnosis Date Noted  . Arthritis 05/09/2016    Prior to Admission medications     Medication Sig Start Date End Date Taking? Authorizing Provider  acetaminophen (TYLENOL) 500 MG tablet Take 1,000 mg by mouth every 6 (six) hours as needed.   Yes [provider]  etanercept (ENBREL SURECLICK) 50 MG/ML injection Inject 50 mg into the skin once a week.   Yes [provider]  ferrous sulfate 325 (65 FE) MG tablet Take 1 tablet (325 mg total) by mouth 2 (two) times daily with a meal. NO MORE REFILLS WITHOUT OFFICE VISIT/ LABS - 2ND NOTICE 03/12/15  Yes Weber, Sarah L, PA-C    No Known Allergies  No past surgical history on file.  Social History   Tobacco Use  . Smoking status: Never Smoker  . Smokeless tobacco: Never Used  Substance Use Topics  . Alcohol use: No  . Drug use: No    Family History  Problem Relation Age of Onset  . Heart disease Maternal Grandfather   . Cancer Paternal Grandmother     Medication list has been reviewed and updated.  Physical Examination:  Physical Exam  Constitutional: She is oriented to person, place, and time. She appears well-developed and well-nourished. No distress.  HENT:  Head: Normocephalic and atraumatic.  Mouth/Throat: Oropharynx is clear and moist.  Eyes: Conjunctivae and EOM are normal. Pupils are equal, round, and reactive to light.  Neck: Neck supple. No thyromegaly present.  Cardiovascular: Normal rate, regular rhythm and normal heart sounds.  No murmur heard. Pulmonary/Chest: Effort normal and breath sounds normal. She has no wheezes.  Abdominal: Soft. Bowel sounds are normal. She exhibits no mass. There is no tenderness.  Genitourinary: Vagina normal and uterus normal.  Cervix exhibits no motion tenderness and no discharge. Right adnexum displays no mass and no tenderness. Left adnexum displays no mass and no tenderness.  Genitourinary Comments: Folliculitis   Musculoskeletal: Normal range of motion.  Neurological: She is alert and oriented to person, place, and time. She has normal  reflexes.  Skin: Skin is warm and dry.  Psychiatric: She has a normal mood and affect. Her behavior is normal. Judgment and thought content normal.  Vitals reviewed.   BP 106/80   Pulse (!) 127   Temp 98.4 F (36.9 C) (Oral)   Resp 16   Ht 5' 8.5" (1.74 m)   Wt 170 lb 3.2 oz (77.2 kg)   LMP 03/08/2017   SpO2 100%   BMI 25.50 kg/m   Assessment and Plan: 1. Annual physical exam - Pt presents for her first annual exam in several years. Unsure if she has ever had a PAP. PAP done today. Will treat for folliculitis in pubic region with mupirocin and cleocin. H/o iron deficiency anemia - checking iron levels. Routine labs are pending. Anticipatory guidance provided.  2. Screening for cervical cancer 3. Encounter for gynecological examination without abnormal finding - Pap IG, CT/NG w/ reflex HPV when ASC-U [employer])  4. Screening, lipid - Lipid panel  5. Screening, anemia, deficiency, iron - CBC with Differential/Platelet  6. Screening for endocrine disorder - CMP14+EGFR - POCT urinalysis dipstick  7. Iron deficiency anemia, unspecified iron deficiency anemia type - Iron, TIBC and Ferritin Panel  8. Folliculitis - mupirocin ointment (BACTROBAN) 2 %; Apply 1 application topically 3 (three) times daily.  Dispense: 22 g; Refill: 1 - clindamycin (CLINDAGEL) 1 % gel; Apply topically 2 (two) times daily.  Dispense: 30 g; Refill: 0   Whitney Mohamedamin Nifong, PA-C  [facility] at [place] 04/10/2017 8:26 AM

## 2017-04-10 NOTE — Patient Instructions (Addendum)
We will try topical antibiotic therapy your folliculitis. You will use topical mupirocin and topical clindamycin  See below for folliculitis and routine health maintenance information. We will contact you with the results of your lab work.   Thank you for coming in today. I hope you feel we met your needs. Feel free to call PCP if you have any questions or further requests. Please consider signing up for MyChart if you do not already have it, as this is a great way to communicate with me.  Best,  Whitney McVey, PA-C   Folliculitis Folliculitis is inflammation of the hair follicles. Folliculitis most commonly occurs on the scalp, thighs, legs, back, and buttocks. However, it can occur anywhere on the body. What are the causes? This condition may be caused by:  A bacterial infection (common).  A fungal infection.  A viral infection.  Coming into contact with certain chemicals, especially oils and tars.  Shaving or waxing.  Applying greasy ointments or creams to your skin often.  Long-lasting folliculitis and folliculitis that keeps coming back can be caused by bacteria that live in the nostrils. What increases the risk? This condition is more likely to develop in people with:  A weakened immune system.  Diabetes.  Obesity.  What are the signs or symptoms? Symptoms of this condition include:  Redness.  Soreness.  Swelling.  Itching.  Small white or yellow, pus-filled, itchy spots (pustules) that appear over a reddened area. If there is an infection that goes deep into the follicle, these may develop into a boil (furuncle).  A group of closely packed boils (carbuncle). These tend to form in hairy, sweaty areas of the body.  How is this diagnosed? This condition is diagnosed with a skin exam. To find what is causing the condition, your health care provider may take a sample of one of the pustules or boils for testing. How is this treated? This condition may be  treated by:  Applying warm compresses to the affected areas.  Taking an antibiotic medicine or applying an antibiotic medicine to the skin.  Applying or bathing with an antiseptic solution.  Taking an over-the-counter medicine to help with itching.  Having a procedure to drain any pustules or boils. This may be done if a pustule or boil contains a lot of pus or fluid.  Laser hair removal. This may be done to treat long-lasting folliculitis.  Follow these instructions at home:  If directed, apply heat to the affected area as often as told by your health care provider. Use the heat source that your health care provider recommends, such as a moist heat pack or a heating pad. ? Place a towel between your skin and the heat source. ? Leave the heat on for 20-30 minutes. ? Remove the heat if your skin turns bright red. This is especially important if you are unable to feel pain, heat, or cold. You may have a greater risk of getting burned.  If you were prescribed an antibiotic medicine, use it as told by your health care provider. Do not stop using the antibiotic even if you start to feel better.  Take over-the-counter and prescription medicines only as told by your health care provider.  Do not shave irritated skin.  Keep all follow-up visits as told by your health care provider. This is important. Get help right away if:  You have more redness, swelling, or pain in the affected area.  Red streaks are spreading from the affected area.  You  have a fever. This information is not intended to replace advice given to you by your health care provider. Make sure you discuss any questions you have with your health care provider. Document Released: 05/26/2001 Document Revised: 10/05/2015 Document Reviewed: 01/05/2015 Elsevier Interactive Patient Education  2018 [place] Maintenance, Female Adopting a healthy lifestyle and getting preventive care can go a long way to promote  health and wellness. Talk with your health care provider about what schedule of regular examinations is right for you. This is a good chance for you to check in with your provider about disease prevention and staying healthy. In between checkups, there are plenty of things you can do on your own. Experts have done a lot of research about which lifestyle changes and preventive measures are most likely to keep you healthy. Ask your health care provider for more information. Weight and diet Eat a healthy diet  Be sure to include plenty of vegetables, fruits, low-fat dairy products, and lean protein.  Do not eat a lot of foods high in solid fats, added sugars, or salt.  Get regular exercise. This is one of the most important things you can do for your health. ? Most adults should exercise for at least 150 minutes each week. The exercise should increase your heart rate and make you sweat (moderate-intensity exercise). ? Most adults should also do strengthening exercises at least twice a week. This is in addition to the moderate-intensity exercise.  Maintain a healthy weight  Body mass index (BMI) is a measurement that can be used to identify possible weight problems. It estimates body fat based on height and weight. Your health care provider can help determine your BMI and help you achieve or maintain a healthy weight.  For females 27 years of age and older: ? A BMI below 18.5 is considered underweight. ? A BMI of 18.5 to 24.9 is normal. ? A BMI of 25 to 29.9 is considered overweight. ? A BMI of 30 and above is considered obese.  Watch levels of cholesterol and blood lipids  You should start having your blood tested for lipids and cholesterol at 29 years of age, then have this test every 5 years.  You may need to have your cholesterol levels checked more often if: ? Your lipid or cholesterol levels are high. ? You are older than 29 years of age. ? You are at high risk for heart  disease.  Cancer screening Lung Cancer  Lung cancer screening is recommended for adults 39-89 years old who are at high risk for lung cancer because of a history of smoking.  A yearly low-dose CT scan of the lungs is recommended for people who: ? Currently smoke. ? Have quit within the past 15 years. ? Have at least a 30-pack-year history of smoking. A pack year is smoking an average of one pack of cigarettes a day for 1 year.  Yearly screening should continue until it has been 15 years since you quit.  Yearly screening should stop if you develop a health problem that would prevent you from having lung cancer treatment.  Breast Cancer  Practice breast self-awareness. This means understanding how your breasts normally appear and feel.  It also means doing regular breast self-exams. Let your health care provider know about any changes, no matter how small.  If you are in your 20s or 30s, you should have a clinical breast exam (CBE) by a health care provider every 1-3 years as part of  a regular health exam.  If you are 68 or older, have a CBE every year. Also consider having a breast X-ray (mammogram) every year.  If you have a family history of breast cancer, talk to your health care provider about genetic screening.  If you are at high risk for breast cancer, talk to your health care provider about having an MRI and a mammogram every year.  Breast cancer gene (BRCA) assessment is recommended for women who have family members with BRCA-related cancers. BRCA-related cancers include: ? Breast. ? Ovarian. ? Tubal. ? Peritoneal cancers.  Results of the assessment will determine the need for genetic counseling and BRCA1 and BRCA2 testing.  Cervical Cancer Your health care provider may recommend that you be screened regularly for cancer of the pelvic organs (ovaries, uterus, and vagina). This screening involves a pelvic examination, including checking for microscopic changes to the  surface of your cervix (Pap test). You may be encouraged to have this screening done every 3 years, beginning at age 20.  For women ages 4-65, health care providers may recommend pelvic exams and Pap testing every 3 years, or they may recommend the Pap and pelvic exam, combined with testing for human papilloma virus (HPV), every 5 years. Some types of HPV increase your risk of cervical cancer. Testing for HPV may also be done on women of any age with unclear Pap test results.  Other health care providers may not recommend any screening for nonpregnant women who are considered low risk for pelvic cancer and who do not have symptoms. Ask your health care provider if a screening pelvic exam is right for you.  If you have had past treatment for cervical cancer or a condition that could lead to cancer, you need Pap tests and screening for cancer for at least 20 years after your treatment. If Pap tests have been discontinued, your risk factors (such as having a new sexual partner) need to be reassessed to determine if screening should resume. Some women have medical problems that increase the chance of getting cervical cancer. In these cases, your health care provider may recommend more frequent screening and Pap tests.  Colorectal Cancer  This type of cancer can be detected and often prevented.  Routine colorectal cancer screening usually begins at 29 years of age and continues through 29 years of age.  Your health care provider may recommend screening at an earlier age if you have risk factors for colon cancer.  Your health care provider may also recommend using home test kits to check for hidden blood in the stool.  A small camera at the end of a tube can be used to examine your colon directly (sigmoidoscopy or colonoscopy). This is done to check for the earliest forms of colorectal cancer.  Routine screening usually begins at age 15.  Direct examination of the colon should be repeated every 5-10  years through 29 years of age. However, you may need to be screened more often if early forms of precancerous polyps or small growths are found.  Skin Cancer  Check your skin from head to toe regularly.  Tell your health care provider about any new moles or changes in moles, especially if there is a change in a mole's shape or color.  Also tell your health care provider if you have a mole that is larger than the size of a pencil eraser.  Always use sunscreen. Apply sunscreen liberally and repeatedly throughout the day.  Protect yourself by wearing long sleeves,  pants, a wide-brimmed hat, and sunglasses whenever you are outside.  Heart disease, diabetes, and high blood pressure  High blood pressure causes heart disease and increases the risk of stroke. High blood pressure is more likely to develop in: ? People who have blood pressure in the high end of the normal range (130-139/85-89 mm Hg). ? People who are overweight or obese. ? People who are African American.  If you are 64-67 years of age, have your blood pressure checked every 3-5 years. If you are 30 years of age or older, have your blood pressure checked every year. You should have your blood pressure measured twice-once when you are at a hospital or clinic, and once when you are not at a hospital or clinic. Record the average of the two measurements. To check your blood pressure when you are not at a hospital or clinic, you can use: ? An automated blood pressure machine at a pharmacy. ? A home blood pressure monitor.  If you are between 73 years and 58 years old, ask your health care provider if you should take aspirin to prevent strokes.  Have regular diabetes screenings. This involves taking a blood sample to check your fasting blood sugar level. ? If you are at a normal weight and have a low risk for diabetes, have this test once every three years after 29 years of age. ? If you are overweight and have a high risk for  diabetes, consider being tested at a younger age or more often. Preventing infection Hepatitis B  If you have a higher risk for hepatitis B, you should be screened for this virus. You are considered at high risk for hepatitis B if: ? You were born in a country where hepatitis B is common. Ask your health care provider which countries are considered high risk. ? Your parents were born in a high-risk country, and you have not been immunized against hepatitis B (hepatitis B vaccine). ? You have HIV or AIDS. ? You use needles to inject street drugs. ? You live with someone who has hepatitis B. ? You have had sex with someone who has hepatitis B. ? You get hemodialysis treatment. ? You take certain medicines for conditions, including cancer, organ transplantation, and autoimmune conditions.  Hepatitis C  Blood testing is recommended for: ? Everyone born from [birth year] through 1967. ? Anyone with known risk factors for hepatitis C.  Sexually transmitted infections (STIs)  You should be screened for sexually transmitted infections (STIs) including gonorrhea and chlamydia if: ? You are sexually active and are younger than 30 years of age. ? You are older than 29 years of age and your health care provider tells you that you are at risk for this type of infection. ? Your sexual activity has changed since you were last screened and you are at an increased risk for chlamydia or gonorrhea. Ask your health care provider if you are at risk.  If you do not have HIV, but are at risk, it may be recommended that you take a prescription medicine daily to prevent HIV infection. This is called pre-exposure prophylaxis (PrEP). You are considered at risk if: ? You are sexually active and do not regularly use condoms or know the HIV status of your partner(s). ? You take drugs by injection. ? You are sexually active with a partner who has HIV.  Talk with your health care provider about whether you are at high risk  of being infected with HIV. If you  choose to begin PrEP, you should first be tested for HIV. You should then be tested every 3 months for as long as you are taking PrEP. Pregnancy  If you are premenopausal and you may become pregnant, ask your health care provider about preconception counseling.  If you may become pregnant, take 400 to 800 micrograms (mcg) of folic acid every day.  If you want to prevent pregnancy, talk to your health care provider about birth control (contraception). Osteoporosis and menopause  Osteoporosis is a disease in which the bones lose minerals and strength with aging. This can result in serious bone fractures. Your risk for osteoporosis can be identified using a bone density scan.  If you are 16 years of age or older, or if you are at risk for osteoporosis and fractures, ask your health care provider if you should be screened.  Ask your health care provider whether you should take a calcium or vitamin D supplement to lower your risk for osteoporosis.  Menopause may have certain physical symptoms and risks.  Hormone replacement therapy may reduce some of these symptoms and risks. Talk to your health care provider about whether hormone replacement therapy is right for you. Follow these instructions at home:  Schedule regular health, dental, and eye exams.  Stay current with your immunizations.  Do not use any tobacco products including cigarettes, chewing tobacco, or electronic cigarettes.  If you are pregnant, do not drink alcohol.  If you are breastfeeding, limit how much and how often you drink alcohol.  Limit alcohol intake to no more than 1 drink per day for nonpregnant women. One drink equals 12 ounces of beer, 5 ounces of wine, or 1 ounces of hard liquor.  Do not use street drugs.  Do not share needles.  Ask your health care provider for help if you need support or information about quitting drugs.  Tell your health care provider if you often  feel depressed.  Tell your health care provider if you have ever been abused or do not feel safe at home. This information is not intended to replace advice given to you by your health care provider. Make sure you discuss any questions you have with your health care provider. Document Released: 09/30/2010 Document Revised: 08/23/2015 Document Reviewed: 12/19/2014 Elsevier Interactive Patient Education  2018 [employer].  IF you received an x-ray today, you will receive an invoice from [facility]. Please contact [facility] at [phone] with questions or concerns regarding your invoice.   IF you received labwork today, you will receive an invoice from [place]. Please contact [facility] at [phone] with questions or concerns regarding your invoice.   Our billing staff will not be able to assist you with questions regarding bills from these companies.  You will be contacted with the lab results as soon as they are available. The fastest way to get your results is to activate your My Chart account. Instructions are located on the last page of this paperwork. If you have not heard from [place] regarding the results in 2 weeks, please contact this office.

## 2017-04-11 LAB — LIPID PANEL
Chol/HDL Ratio: 4.1 ratio (ref 0.0–4.4)
Cholesterol, Total: 165 mg/dL (ref 100–199)
HDL: 40 mg/dL (ref >39)
LDL Calculated: 103 mg/dL — ABNORMAL HIGH (ref 0–99)
Triglycerides: 109 mg/dL (ref 0–149)
VLDL Cholesterol Cal: 22 mg/dL (ref 5–40)

## 2017-04-11 LAB — CMP14+EGFR
ALT: 11 IU/L (ref 0–32)
AST: 13 IU/L (ref 0–40)
Albumin/Globulin Ratio: 1.5 (ref 1.2–2.2)
Albumin: 4.8 g/dL (ref 3.5–5.5)
Alkaline Phosphatase: 66 IU/L (ref 39–117)
BUN/Creatinine Ratio: 11 (ref 9–23)
BUN: 9 mg/dL (ref 6–20)
Bilirubin Total: 0.3 mg/dL (ref 0.0–1.2)
CO2: 18 mmol/L — ABNORMAL LOW (ref 20–29)
Calcium: 9.6 mg/dL (ref 8.7–10.2)
Chloride: 106 mmol/L (ref 96–106)
Creatinine, Ser: 0.82 mg/dL (ref 0.57–1.00)
GFR calc Af Amer: 113 mL/min/{1.73_m2} (ref >59)
GFR calc non Af Amer: 98 mL/min/{1.73_m2} (ref >59)
Globulin, Total: 3.1 g/dL (ref 1.5–4.5)
Glucose: 101 mg/dL — ABNORMAL HIGH (ref 65–99)
Potassium: 3.9 mmol/L (ref 3.5–5.2)
Sodium: 140 mmol/L (ref 134–144)
Total Protein: 7.9 g/dL (ref 6.0–8.5)

## 2017-04-11 LAB — CBC WITH DIFFERENTIAL/PLATELET
Basophils Absolute: 0 10*3/uL (ref 0.0–0.2)
Basos: 0 %
EOS (ABSOLUTE): 0.1 10*3/uL (ref 0.0–0.4)
Eos: 1 %
Hematocrit: 41.4 % (ref 34.0–46.6)
Hemoglobin: 13.9 g/dL (ref 11.1–15.9)
Immature Grans (Abs): 0 10*3/uL (ref 0.0–0.1)
Immature Granulocytes: 0 %
Lymphocytes Absolute: 1.9 10*3/uL (ref 0.7–3.1)
Lymphs: 32 %
MCH: 29.8 pg (ref 26.6–33.0)
MCHC: 33.6 g/dL (ref 31.5–35.7)
MCV: 89 fL (ref 79–97)
Monocytes Absolute: 0.5 10*3/uL (ref 0.1–0.9)
Monocytes: 8 %
Neutrophils Absolute: 3.6 10*3/uL (ref 1.4–7.0)
Neutrophils: 59 %
Platelets: 284 10*3/uL (ref 150–379)
RBC: 4.67 x10E6/uL (ref 3.77–5.28)
RDW: 13.3 % (ref 12.3–15.4)
WBC: 6.1 10*3/uL (ref 3.4–10.8)

## 2017-04-11 LAB — IRON,TIBC AND FERRITIN PANEL
Ferritin: 46 ng/mL (ref 15–150)
Iron Saturation: 24 % (ref 15–55)
Iron: 84 ug/dL (ref 27–159)
Total Iron Binding Capacity: 353 ug/dL (ref 250–450)
UIBC: 269 ug/dL (ref 131–425)

## 2017-04-14 LAB — PAP IG, CT-NG, RFX HPV ASCU
Chlamydia, Nuc. Acid Amp: NEGATIVE
Gonococcus by Nucleic Acid Amp: NEGATIVE
PAP Smear Comment: 0

## 2017-04-17 ENCOUNTER — Ambulatory Visit (INDEPENDENT_AMBULATORY_CARE_PROVIDER_SITE_OTHER): Payer: Managed Care, Other (non HMO) | Admitting: Physician Assistant

## 2017-04-17 ENCOUNTER — Encounter: Payer: Self-pay | Admitting: Physician Assistant

## 2017-04-17 VITALS — BP 118/77 | HR 95 | Temp 98.6°F | Resp 16 | Ht 68.5 in | Wt 170.0 lb

## 2017-04-17 DIAGNOSIS — Z23 Encounter for immunization: Secondary | ICD-10-CM

## 2017-04-17 NOTE — Progress Notes (Signed)
Nurse visit only

## 2017-04-17 NOTE — Patient Instructions (Addendum)
Need for tdap injection only.Tdap Vaccine (Tetanus, Diphtheria and Pertussis): What You Need to Know 1. Why get vaccinated? Tetanus, diphtheria and pertussis are very serious diseases. Tdap vaccine can protect [place] from these diseases. And, Tdap vaccine given to pregnant women can protect newborn babies against pertussis. TETANUS (Lockjaw) is rare in the [place] today. It causes painful muscle tightening and stiffness, usually all over the body.  It can lead to tightening of muscles in the head and neck so you can't open your mouth, swallow, or sometimes even breathe. Tetanus kills about 1 out of 10 people who are infected even after receiving the best medical care.  DIPHTHERIA is also rare in the [place] today. It can cause a thick coating to form in the back of the throat.  It can lead to breathing problems, heart failure, paralysis, and death.  PERTUSSIS (Whooping Cough) causes severe coughing spells, which can cause difficulty breathing, vomiting and disturbed sleep.  It can also lead to weight loss, incontinence, and rib fractures. Up to 2 in 100 adolescents and 5 in 100 adults with pertussis are hospitalized or have complications, which could include pneumonia or death.  These diseases are caused by bacteria. Diphtheria and pertussis are spread from person to person through secretions from coughing or sneezing. Tetanus enters the body through cuts, scratches, or wounds. Before vaccines, as many as 200,000 cases of diphtheria, 200,000 cases of pertussis, and hundreds of cases of tetanus, were reported in the [place] each year. Since vaccination began, reports of cases for tetanus and diphtheria have dropped by about 99% and for pertussis by about 80%. 2. Tdap vaccine Tdap vaccine can protect adolescents and adults from tetanus, diphtheria, and pertussis. One dose of Tdap is routinely given at age 44 or 59. People who did not get Tdap at that age should get it as soon as  possible. Tdap is especially important for healthcare professionals and anyone having close contact with a baby younger than 12 months. Pregnant women should get a dose of Tdap during every pregnancy, to protect the newborn from pertussis. Infants are most at risk for severe, life-threatening complications from pertussis. Another vaccine, called Td, protects against tetanus and diphtheria, but not pertussis. A Td booster should be given every 10 years. Tdap may be given as one of these boosters if you have never gotten Tdap before. Tdap may also be given after a severe cut or burn to prevent tetanus infection. Your doctor or the person giving you the vaccine can give you more information. Tdap may safely be given at the same time as other vaccines. 3. Some people should not get this vaccine  A person who has ever had a life-threatening allergic reaction after a previous dose of any diphtheria, tetanus or pertussis containing vaccine, OR has a severe allergy to any part of this vaccine, should not get Tdap vaccine. Tell the person giving the vaccine about any severe allergies.  Anyone who had coma or long repeated seizures within 7 days after a childhood dose of DTP or DTaP, or a previous dose of Tdap, should not get Tdap, unless a cause other than the vaccine was found. They can still get Td.  Talk to your doctor if you: ? have seizures or another nervous system problem, ? had severe pain or swelling after any vaccine containing diphtheria, tetanus or pertussis, ? ever had a condition called Guillain-Barr Syndrome (GBS), ? aren't feeling well on the day the shot is scheduled. 4. Risks  With any medicine, including vaccines, there is a chance of side effects. These are usually mild and go away on their own. Serious reactions are also possible but are rare. Most people who get Tdap vaccine do not have any problems with it. Mild problems following Tdap: (Did not interfere with activities)  Pain  where the shot was given (about 3 in 4 adolescents or 2 in 3 adults)  Redness or swelling where the shot was given (about 1 person in 5)  Mild fever of at least 100.88F (up to about 1 in 25 adolescents or 1 in 100 adults)  Headache (about 3 or 4 people in 10)  Tiredness (about 1 person in 3 or 4)  Nausea, vomiting, diarrhea, stomach ache (up to 1 in 4 adolescents or 1 in 10 adults)  Chills, sore joints (about 1 person in 10)  Body aches (about 1 person in 3 or 4)  Rash, swollen glands (uncommon)  Moderate problems following Tdap: (Interfered with activities, but did not require medical attention)  Pain where the shot was given (up to 1 in 5 or 6)  Redness or swelling where the shot was given (up to about 1 in 16 adolescents or 1 in 12 adults)  Fever over 102F (about 1 in 100 adolescents or 1 in 250 adults)  Headache (about 1 in 7 adolescents or 1 in 10 adults)  Nausea, vomiting, diarrhea, stomach ache (up to 1 or 3 people in 100)  Swelling of the entire arm where the shot was given (up to about 1 in 500).  Severe problems following Tdap: (Unable to perform usual activities; required medical attention)  Swelling, severe pain, bleeding and redness in the arm where the shot was given (rare).  Problems that could happen after any vaccine:  People sometimes faint after a medical procedure, including vaccination. Sitting or lying down for about 15 minutes can help prevent fainting, and injuries caused by a fall. Tell your doctor if you feel dizzy, or have vision changes or ringing in the ears.  Some people get severe pain in the shoulder and have difficulty moving the arm where a shot was given. This happens very rarely.  Any medication can cause a severe allergic reaction. Such reactions from a vaccine are very rare, estimated at fewer than 1 in a million doses, and would happen within a few minutes to a few hours after the vaccination. As with any medicine, there is a very  remote chance of a vaccine causing a serious injury or death. The safety of vaccines is always being monitored. For more information, visit: http://floyd.org/ 5. What if there is a serious problem? What should I look for? Look for anything that concerns you, such as signs of a severe allergic reaction, very high fever, or unusual behavior. Signs of a severe allergic reaction can include hives, swelling of the face and throat, difficulty breathing, a fast heartbeat, dizziness, and weakness. These would usually start a few minutes to a few hours after the vaccination. What should I do?  If you think it is a severe allergic reaction or other emergency that can't wait, call 9-1-1 or get the person to the nearest hospital. Otherwise, call your doctor.  Afterward, the reaction should be reported to the Vaccine Adverse Event Reporting System (VAERS). Your doctor might file this report, or you can do it yourself through the VAERS web site at www.vaers.LAgents.no, or by calling 1-(630) 148-6874. ? VAERS does not give medical advice. 6. The National Vaccine Injury  Compensation Program The [employer] Vaccine Injury Compensation Program (VICP) is a federal program that was created to compensate people who may have been injured by certain vaccines. Persons who believe they may have been injured by a vaccine can learn about the program and about filing a claim by calling 1-249-157-9096 or visiting the VICP website at SpiritualWord.atwww.hrsa.gov/vaccinecompensation. There is a time limit to file a claim for compensation. 7. How can I learn more?  Ask your doctor. He or she can give you the vaccine package insert or suggest other sources of information.  Call your local or state health department.  Contact the Centers for Disease Control and Prevention (CDC): ? Call 60857335041-830-519-7107 (1-800-CDC-INFO) or ? Visit CDC's website at PicCapture.uywww.cdc.gov/vaccines CDC Tdap Vaccine VIS (05/24/13) This information is not intended to replace  advice given to you by your health care provider. Make sure you discuss any questions you have with your health care provider. Document Released: 09/16/2011 Document Revised: 12/06/2015 Document Reviewed: 12/06/2015 Elsevier Interactive Patient Education  2017 [employer]

## 2017-05-28 ENCOUNTER — Encounter: Payer: Self-pay | Admitting: Physician Assistant

## 2017-06-05 ENCOUNTER — Ambulatory Visit (INDEPENDENT_AMBULATORY_CARE_PROVIDER_SITE_OTHER): Payer: Managed Care, Other (non HMO) | Admitting: Physician Assistant

## 2017-06-05 ENCOUNTER — Encounter: Payer: Self-pay | Admitting: Physician Assistant

## 2017-06-05 VITALS — BP 126/68 | HR 104 | Temp 99.0°F | Resp 17 | Ht 70.0 in | Wt 172.0 lb

## 2017-06-05 DIAGNOSIS — L739 Follicular disorder, unspecified: Secondary | ICD-10-CM

## 2017-06-05 MED ORDER — DOXYCYCLINE HYCLATE 100 MG PO CAPS: 100.0000 mg | ORAL_CAPSULE | Freq: Two times a day (BID) | ORAL | 0 refills | Status: DC

## 2017-06-05 NOTE — Progress Notes (Signed)
   Dana Arroyo  MRN: [id number] DOB: [birth year]  PCP: Patient, No Pcp Per  Subjective:  Pt is a 27 year old female presents to clinic for f/u folliculitis. She was here for her annual exam 1/11 c/o the same problem. Rx for mupirocin and clindagel. She has been using these with no improvement. Bumps seem larger after showering. More annoying than anything.  She has been suffering from this problem x 6 months. This is affecting her sexual life with her partner. She has not been sexually intimate in 6 months.  Denies pain, drainage, fever, chills.   Review of Systems  Constitutional: Negative for chills, diaphoresis, fatigue and fever.  Skin: Positive for wound.    Patient Active Problem List   Diagnosis Date Noted  . Iron deficiency anemia 04/10/2017  . Folliculitis 04/10/2017  . Arthritis 05/09/2016    Current Outpatient Medications on File Prior to Visit  Medication Sig Dispense Refill  . clindamycin (CLINDAGEL) 1 % gel Apply topically 2 (two) times daily. 30 g 0  . etanercept (ENBREL SURECLICK) 50 MG/ML injection Inject 50 mg into the skin once a week.    . mupirocin ointment (BACTROBAN) 2 % Apply 1 application topically 3 (three) times daily. 22 g 1   No current facility-administered medications on file prior to visit.     No Known Allergies   Objective:  BP 126/68   Pulse (!) 104   Temp 99 F (37.2 C) (Oral)   Resp 17   Ht 5\' 10"  (1.778 m)   Wt 172 lb (78 kg)   LMP 05/22/2017 (Approximate)   SpO2 98%   BMI 24.68 kg/m   Physical Exam  Constitutional: She is oriented to person, place, and time and well-developed, well-nourished, and in no distress. No distress.  Genitourinary:  Genitourinary Comments: Two 1-cm violaceous round lesions mons pubis right of center. Superior lesion has mild fluctuance centrally. No drainage. Not TTP. No surrounding erythema.    Neurological: She is alert and oriented to person, place, and time. GCS score is 15.  Skin: Skin is warm and  dry.  Psychiatric: Mood, memory, affect and judgment normal.  Vitals reviewed.   Assessment and Plan :  1. Folliculitis - doxycycline (VIBRAMYCIN) 100 MG capsule; Take 1 capsule (100 mg total) by mouth 2 (two) times daily.  Dispense: 20 capsule; Refill: 0 - Pt con't to have problem with folliculitis on her mons pubis, refractory to topical treatment with mupirocin and clindagel. Plan to treat with oral antibiotic. No wound culture was taken as there is no drainage today. Consider I&D in the future. She understands.   Marco CollieWhitney Luvenia Cranford, PA-C  Primary Care at [facility] 06/05/2017 8:09 AM

## 2017-06-05 NOTE — Patient Instructions (Addendum)
Start 10 days course of Doxycycline. Administer capsules and tablets with at least 8 ounces (240 mL) of water and have patient sit up for at least 30 minutes after taking. If your stomach is upset, take with food.  Let me know if it clears up! Be sure you shave with the grain of your hairs.   Thank you for coming in today. I hope you feel we met your needs.  Feel free to call PCP if you have any questions or further requests.  Please consider signing up for MyChart if you do not already have it, as this is a great way to communicate with me.  Best,  Whitney McVey, PA-C    Doxycycline tablets or capsules What is this medicine? DOXYCYCLINE (dox i SYE kleen) is a tetracycline antibiotic. It kills certain bacteria or stops their growth. It is used to treat many kinds of infections, like dental, skin, respiratory, and urinary tract infections. It also treats acne, Lyme disease, malaria, and certain sexually transmitted infections. This medicine may be used for other purposes; ask your health care provider or pharmacist if you have questions. COMMON BRAND NAME(S): Acticlate, Adoxa, Adoxa CK, Adoxa Pak, Adoxa TT, Alodox, Avidoxy, Doxal, Mondoxyne NL, Monodox, Morgidox 1x, Morgidox 1x Kit, Morgidox 2x, Morgidox 2x Kit, NutriDox, Ocudox, TARGADOX, Vibra-Tabs, Vibramycin What should I tell my health care provider before I take this medicine? They need to know if you have any of these conditions: -liver disease -long exposure to sunlight like working outdoors -stomach problems like colitis -an unusual or allergic reaction to doxycycline, tetracycline antibiotics, other medicines, foods, dyes, or preservatives -pregnant or trying to get pregnant -breast-feeding How should I use this medicine? Take this medicine by mouth with a full glass of water. Follow the directions on the prescription label. It is best to take this medicine without food, but if it upsets your stomach take it with food. Take your  medicine at regular intervals. Do not take your medicine more often than directed. Take all of your medicine as directed even if you think you are better. Do not skip doses or stop your medicine early. Talk to your pediatrician regarding the use of this medicine in children. While this drug may be prescribed for selected conditions, precautions do apply. Overdosage: If you think you have taken too much of this medicine contact a poison control center or emergency room at once. NOTE: This medicine is only for you. Do not share this medicine with others. What if I miss a dose? If you miss a dose, take it as soon as you can. If it is almost time for your next dose, take only that dose. Do not take double or extra doses. What may interact with this medicine? -antacids -barbiturates -birth control pills -bismuth subsalicylate -carbamazepine -methoxyflurane -other antibiotics -phenytoin -vitamins that contain iron -warfarin This list may not describe all possible interactions. Give your health care provider a list of all the medicines, herbs, non-prescription drugs, or dietary supplements you use. Also tell them if you smoke, drink alcohol, or use illegal drugs. Some items may interact with your medicine. What should I watch for while using this medicine? Tell your doctor or health care professional if your symptoms do not improve. Do not treat diarrhea with over the counter products. Contact your doctor if you have diarrhea that lasts more than 2 days or if it is severe and watery. Do not take this medicine just before going to bed. It may not dissolve properly when  you lay down and can cause pain in your throat. Drink plenty of fluids while taking this medicine to also help reduce irritation in your throat. This medicine can make you more sensitive to the sun. Keep out of the sun. If you cannot avoid being in the sun, wear protective clothing and use sunscreen. Do not use sun lamps or tanning  beds/booths. Birth control pills may not work properly while you are taking this medicine. Talk to your doctor about using an extra method of birth control. If you are being treated for a sexually transmitted infection, avoid sexual contact until you have finished your treatment. Your sexual partner may also need treatment. Avoid antacids, aluminum, calcium, magnesium, and iron products for 4 hours before and 2 hours after taking a dose of this medicine. If you are using this medicine to prevent malaria, you should still protect yourself from contact with mosquitos. Stay in screened-in areas, use mosquito nets, keep your body covered, and use an insect repellent. What side effects may I notice from receiving this medicine? Side effects that you should report to your doctor or health care professional as soon as possible: -allergic reactions like skin rash, itching or hives, swelling of the face, lips, or tongue -difficulty breathing -fever -itching in the rectal or genital area -pain on swallowing -redness, blistering, peeling or loosening of the skin, including inside the mouth -severe stomach pain or cramps -unusual bleeding or bruising -unusually weak or tired -yellowing of the eyes or skin Side effects that usually do not require medical attention (report to your doctor or health care professional if they continue or are bothersome): -diarrhea -loss of appetite -nausea, vomiting This list may not describe all possible side effects. Call your doctor for medical advice about side effects. You may report side effects to FDA at 1-800-FDA-1088. Where should I keep my medicine? Keep out of the reach of children. Store at room temperature, below 30 degrees C (86 degrees F). Protect from light. Keep container tightly closed. Throw away any unused medicine after the expiration date. Taking this medicine after the expiration date can make you seriously ill. NOTE: This sheet is a summary. It may  not cover all possible information. If you have questions about this medicine, talk to your doctor, pharmacist, or health care provider.  2018 Elsevier/Gold Standard (2015-04-18 17:11:22)  IF you received an x-ray today, you will receive an invoice from [facility]. Please contact [facility] at [phone] with questions or concerns regarding your invoice.   IF you received labwork today, you will receive an invoice from [place]. Please contact [facility] at [phone] with questions or concerns regarding your invoice.   Our billing staff will not be able to assist you with questions regarding bills from these companies.  You will be contacted with the lab results as soon as they are available. The fastest way to get your results is to activate your My Chart account. Instructions are located on the last page of this paperwork. If you have not heard from [place] regarding the results in 2 weeks, please contact this office.

## 2017-06-15 ENCOUNTER — Encounter: Payer: Self-pay | Admitting: Physician Assistant

## 2017-08-04 ENCOUNTER — Ambulatory Visit (INDEPENDENT_AMBULATORY_CARE_PROVIDER_SITE_OTHER): Payer: Managed Care, Other (non HMO) | Admitting: Physician Assistant

## 2017-08-04 ENCOUNTER — Other Ambulatory Visit: Payer: Self-pay

## 2017-08-04 ENCOUNTER — Encounter: Payer: Self-pay | Admitting: Physician Assistant

## 2017-08-04 VITALS — BP 108/74 | HR 92 | Temp 97.9°F | Resp 16 | Ht 69.25 in | Wt 168.8 lb

## 2017-08-04 DIAGNOSIS — L739 Follicular disorder, unspecified: Secondary | ICD-10-CM | POA: Diagnosis not present

## 2017-08-04 MED ORDER — CLOBETASOL PROPIONATE 0.05 % EX FOAM: Freq: Two times a day (BID) | CUTANEOUS | 0 refills | Status: DC

## 2017-08-04 NOTE — Progress Notes (Signed)
   Dana Arroyo  MRN: [id number] DOB: 1990-05-29  PCP: Patient, No Pcp Per  Subjective:  Pt is a pleasant 9 female presents to clinic for worsening sores in her groin area.  This is her third OV for this problem - she has been suffering from this for about 8 months. Problem is worsening. Area has not improved with prior treatments including oral doxycycline or topical mupirocin and clindagel. She is no longer shaving, she uses scissors to groom pubic hair. She has applied warm compress in the past, this does not help. Denies purulent drainage. Endorses some occasional bloody drainage. Lesions are "itchy".  She has been to dermatology in the past "for really bad acne". She cannot recall the name of her dermatologist.   Review of Systems  Constitutional: Negative for chills, diaphoresis, fatigue and fever.  Genitourinary: Negative for vaginal bleeding, vaginal discharge and vaginal pain.  Skin: Positive for rash.    Patient Active Problem List   Diagnosis Date Noted  . Iron deficiency anemia 04/10/2017  . Folliculitis 04/10/2017  . Arthritis 05/09/2016    Current Outpatient Medications on File Prior to Visit  Medication Sig Dispense Refill  . etanercept (ENBREL SURECLICK) 50 MG/ML injection Inject 50 mg into the skin once a week.     No current facility-administered medications on file prior to visit.     No Known Allergies   Objective:  BP 108/74 (BP Location: Left Arm, Patient Position: Sitting, Cuff Size: Normal)   Pulse 92   Temp 97.9 F (36.6 C) (Oral)   Resp 16   Ht 5' 9.25" (1.759 m)   Wt 168 lb 12.8 oz (76.6 kg)   LMP 07/25/2017   SpO2 100%   BMI 24.75 kg/m   Physical Exam  Constitutional: She is oriented to person, place, and time. No distress.  Neurological: She is alert and oriented to person, place, and time.  Skin: Skin is warm and dry.     Psychiatric: Judgment normal.  Vitals reviewed.   Assessment and Plan :  1. Folliculitis - Ambulatory referral  to Dermatology - clobetasol (OLUX) 0.05 % topical foam; Apply topically 2 (two) times daily. For 2 weeks.  Dispense: 50 g; Refill: 0 - pt presents with worsening lesions of her pubic hair region x 8 months refractory to treatment with oral doxycycline or topical mupirocin and clindagel. Plan to try topical corticosteroid x 2 weeks and refer to dermatology for evaluation. She understands and agrees with plan.   Marco Collie, PA-C  Primary Care at [facility] 08/04/2017 8:34 AM

## 2017-08-04 NOTE — Patient Instructions (Addendum)
  You are not improving with oral or topical antibiotics, so let's try a topical corticosteroid. Apply the foam to area twice daily for 2 weeks. External use only. (this medication may be expensive - I have a note to the pharmacy to fill the most affordable option. If this medication is cost prohibitive, please leave a message for me and I will send in a different option)   You will receive a phone call in 1-2 weeks to schedule an appointment with dermatology. Continue to avoid shaving.   Thank you for coming in today. I hope you feel we met your needs.  Feel free to call PCP if you have any questions or further requests.  Please consider signing up for MyChart if you do not already have it, as this is a great way to communicate with me.  Best,  Whitney McVey, PA-C  IF you received an x-ray today, you will receive an invoice from [facility]. Please contact [facility] at [phone] with questions or concerns regarding your invoice.   IF you received labwork today, you will receive an invoice from [place]. Please contact [facility] at [phone] with questions or concerns regarding your invoice.   Our billing staff will not be able to assist you with questions regarding bills from these companies.  You will be contacted with the lab results as soon as they are available. The fastest way to get your results is to activate your My Chart account. Instructions are located on the last page of this paperwork. If you have not heard from [place] regarding the results in 2 weeks, please contact this office.

## 2018-05-11 ENCOUNTER — Other Ambulatory Visit: Payer: Self-pay | Admitting: Physician Assistant

## 2018-05-11 DIAGNOSIS — L739 Follicular disorder, unspecified: Secondary | ICD-10-CM

## 2018-08-17 ENCOUNTER — Ambulatory Visit (HOSPITAL_COMMUNITY)
Admission: EM | Admit: 2018-08-17 | Discharge: 2018-08-17 | Disposition: A | Discharge status: Home / Self Care | Payer: Managed Care, Other (non HMO) | Attending: Family Medicine | Admitting: Family Medicine

## 2018-08-17 ENCOUNTER — Encounter (HOSPITAL_COMMUNITY): Payer: Self-pay | Admitting: Family Medicine

## 2018-08-17 ENCOUNTER — Other Ambulatory Visit: Payer: Self-pay

## 2018-08-17 DIAGNOSIS — J302 Other seasonal allergic rhinitis: Secondary | ICD-10-CM | POA: Diagnosis not present

## 2018-08-17 DIAGNOSIS — H6122 Impacted cerumen, left ear: Secondary | ICD-10-CM | POA: Diagnosis not present

## 2018-08-17 DIAGNOSIS — H6523 Chronic serous otitis media, bilateral: Secondary | ICD-10-CM

## 2018-08-17 MED ORDER — FLUTICASONE PROPIONATE 50 MCG/ACT NA SUSP: 2.0000 | Freq: Every day | NASAL | 12 refills | Status: DC

## 2018-08-17 NOTE — ED Provider Notes (Signed)
[facility]    CSN: [id number] Arrival date & time: 08/17/18  1656     History   Chief Complaint Chief Complaint  Patient presents with  . Otalgia    HPI Dana Arroyo is a 28 y.o. female.   This is the first [facility] visit for this 28 yo woman complaining of left ear discomfort.  She has been having difficulty for about a week thinking that it might be allergies or wax.  Last few days she has been using Debrox and after using it this morning, found that her hearing was persistently diminished.  Patient also has had quite a bit of sinus congestion this spring.  She normally has some symptoms, but this year these symptoms have been worse than usual.     Past Medical History:  Diagnosis Date  . Arthritis     Patient Active Problem List   Diagnosis Date Noted  . Iron deficiency anemia 04/10/2017  . Folliculitis 04/10/2017  . Arthritis 05/09/2016    History reviewed. No pertinent surgical history.  OB History   No obstetric history on file.      Home Medications    Prior to Admission medications   Medication Sig Start Date End Date Taking? Authorizing Provider  clobetasol (OLUX) 0.05 % topical foam Apply topically 2 (two) times daily. For 2 weeks. 08/04/17   McVey, Madelaine Bhat, PA-C  etanercept (ENBREL SURECLICK) 50 MG/ML injection Inject 50 mg into the skin once a week.    [provider]  fluticasone (FLONASE) 50 MCG/ACT nasal spray Place 2 sprays into both nostrils daily. 08/17/18   Elvina Sidle, MD    Family History Family History  Problem Relation Age of Onset  . Heart disease Maternal Grandfather   . Cancer Paternal Grandmother     Social History Social History   Tobacco Use  . Smoking status: Never Smoker  . Smokeless tobacco: Never Used  Substance Use Topics  . Alcohol use: Yes    Alcohol/week: 1.0 standard drinks    Types: 1 Standard drinks or equivalent per week  . Drug use: No     Allergies   Patient has no known  allergies.   Review of Systems Review of Systems  HENT: Positive for congestion and hearing loss.   All other systems reviewed and are negative.    Physical Exam Triage Vital Signs ED Triage Vitals  Enc Vitals Group     BP      Pulse      Resp      Temp      Temp src      SpO2      Weight      Height      Head Circumference      Peak Flow      Pain Score      Pain Loc      Pain Edu?      Excl. in GC?    No data found.  Updated Vital Signs BP (!) 123/93 (BP Location: Right Arm)   Pulse (!) 102   Temp 98.3 F (36.8 C) (Oral)   Resp 18   SpO2 99%    Physical Exam Vitals signs and nursing note reviewed.  Constitutional:      Appearance: Normal appearance.  HENT:     Ears:     Comments: Bubbly clear fluid behind TM's     Mouth/Throat:     Mouth: Mucous membranes are moist.  Eyes:  Conjunctiva/sclera: Conjunctivae normal.  Neck:     Musculoskeletal: Normal range of motion and neck supple.  Pulmonary:     Effort: Pulmonary effort is normal.  Musculoskeletal: Normal range of motion.  Skin:    General: Skin is warm and dry.  Neurological:     General: No focal deficit present.     Mental Status: She is alert and oriented to person, place, and time.  Psychiatric:        Mood and Affect: Mood normal.        Behavior: Behavior normal.    Left ear lavaged clear.  UC Treatments / Results  Labs (all labs ordered are listed, but only abnormal results are displayed) Labs Reviewed - No data to display  EKG None  Radiology No results found.  Procedures Procedures (including critical care time)  Medications Ordered in UC Medications - No data to display  Initial Impression / Assessment and Plan / UC Course  I have reviewed the triage vital signs and the nursing notes.  Pertinent labs & imaging results that were available during my care of the patient were reviewed by me and considered in my medical decision making (see chart for details).     Final Clinical Impressions(s) / UC Diagnoses   Final diagnoses:  Impacted cerumen of left ear  Bilateral chronic serous otitis media  Seasonal allergies   Discharge Instructions   None    ED Prescriptions    Medication Sig Dispense Auth. Provider   fluticasone (FLONASE) 50 MCG/ACT nasal spray Place 2 sprays into both nostrils daily. 16 g Elvina SidleLauenstein, Theodoro Koval, MD     Controlled Substance Prescriptions [place] Controlled Substance Registry consulted? Not Applicable   Elvina SidleLauenstein, Kaileena Obi, MD 08/17/18 1740

## 2018-08-17 NOTE — ED Triage Notes (Signed)
Pt sts left ear is clogged

## 2018-09-24 ENCOUNTER — Encounter (HOSPITAL_COMMUNITY): Payer: Self-pay

## 2018-09-24 ENCOUNTER — Ambulatory Visit (HOSPITAL_COMMUNITY)
Admission: EM | Admit: 2018-09-24 | Discharge: 2018-09-24 | Disposition: A | Discharge status: Home / Self Care | Payer: Managed Care, Other (non HMO) | Attending: Family Medicine | Admitting: Family Medicine

## 2018-09-24 DIAGNOSIS — H9203 Otalgia, bilateral: Secondary | ICD-10-CM | POA: Diagnosis not present

## 2018-09-24 DIAGNOSIS — H6121 Impacted cerumen, right ear: Secondary | ICD-10-CM

## 2018-09-24 DIAGNOSIS — H6122 Impacted cerumen, left ear: Secondary | ICD-10-CM | POA: Diagnosis not present

## 2018-09-24 DIAGNOSIS — H6123 Impacted cerumen, bilateral: Secondary | ICD-10-CM

## 2018-09-24 MED ORDER — NEOMYCIN-POLYMYXIN-HC 3.5-10000-1 OT SUSP: 3.0000 [drp] | Freq: Three times a day (TID) | OTIC | 0 refills | Status: DC

## 2018-09-24 NOTE — ED Provider Notes (Signed)
[facility]    CSN: [id number] Arrival date & time: 09/24/18  [id number]     History   Chief Complaint Chief Complaint  Patient presents with  . Ear Fullness    Left    HPI Dana Arroyo is a 28 y.o. female.   HPI  Patient is here for bilateral ear fullness.  Left greater than right.  She has no cough cold runny nose.  No ear pain.  No drainage.  No swimming.  She states she is prone to earwax buildup.  She thinks this is what is going on.  Past Medical History:  Diagnosis Date  . Arthritis     Patient Active Problem List   Diagnosis Date Noted  . Iron deficiency anemia 04/10/2017  . Folliculitis 04/10/2017  . Arthritis 05/09/2016    History reviewed. No pertinent surgical history.  OB History   No obstetric history on file.      Home Medications    Prior to Admission medications   Medication Sig Start Date End Date Taking? Authorizing Provider  clobetasol (OLUX) 0.05 % topical foam Apply topically 2 (two) times daily. For 2 weeks. 08/04/17   McVey, Madelaine BhatElizabeth Whitney, PA-C  etanercept (ENBREL SURECLICK) 50 MG/ML injection Inject 50 mg into the skin once a week.    [provider]  fluticasone (FLONASE) 50 MCG/ACT nasal spray Place 2 sprays into both nostrils daily. 08/17/18   Elvina SidleLauenstein, Kurt, MD  neomycin-polymyxin-hydrocortisone (CORTISPORIN) 3.5-10000-1 OTIC suspension Place 3 drops into the left ear 3 (three) times daily. 09/24/18   Eustace MooreNelson, Mistie Adney Sue, MD    Family History Family History  Problem Relation Age of Onset  . Heart disease Maternal Grandfather   . Cancer Paternal Grandmother     Social History Social History   Tobacco Use  . Smoking status: Never Smoker  . Smokeless tobacco: Never Used  Substance Use Topics  . Alcohol use: Yes    Alcohol/week: 1.0 standard drinks    Types: 1 Standard drinks or equivalent per week  . Drug use: No     Allergies   Patient has no known allergies.   Review of Systems Review of Systems   Constitutional: Negative for chills and fever.  HENT: Positive for hearing loss. Negative for ear pain and sore throat.   Eyes: Negative for pain and visual disturbance.  Respiratory: Negative for cough and shortness of breath.   Cardiovascular: Negative for chest pain and palpitations.  Gastrointestinal: Negative for abdominal pain and vomiting.  Genitourinary: Negative for dysuria and hematuria.  Musculoskeletal: Negative for arthralgias and back pain.  Skin: Negative for color change and rash.  Neurological: Negative for seizures and syncope.  All other systems reviewed and are negative.    Physical Exam Triage Vital Signs ED Triage Vitals [09/24/18 0833]  Enc Vitals Group     BP 126/87     Pulse Rate 96     Resp 18     Temp 98.4 F (36.9 C)     Temp Source Oral     SpO2 99 %     Weight      Height      Head Circumference      Peak Flow      Pain Score 0     Pain Loc      Pain Edu?      Excl. in GC?    No data found.  Updated Vital Signs BP 126/87 (BP Location: Left Arm)   Pulse 96  Temp 98.4 F (36.9 C) (Oral)   Resp 18   LMP 09/08/2018   SpO2 99%    Physical Exam Constitutional:      General: She is not in acute distress.    Appearance: She is well-developed.  HENT:     Head: Normocephalic and atraumatic.     Right Ear: There is impacted cerumen.     Left Ear: There is impacted cerumen.     Mouth/Throat:     Mouth: Mucous membranes are moist.     Pharynx: No posterior oropharyngeal erythema.  Eyes:     Conjunctiva/sclera: Conjunctivae normal.     Pupils: Pupils are equal, round, and reactive to light.  Neck:     Musculoskeletal: Normal range of motion.  Cardiovascular:     Rate and Rhythm: Normal rate.  Pulmonary:     Effort: Pulmonary effort is normal. No respiratory distress.  Abdominal:     General: There is no distension.     Palpations: Abdomen is soft.  Musculoskeletal: Normal range of motion.  Lymphadenopathy:     Cervical: No  cervical adenopathy.  Skin:    General: Skin is warm and dry.  Neurological:     Mental Status: She is alert.    To lavage and curette the right TM and canal are clear.  The left TM is normal.  The canal has some abrasion with light bleeding.  UC Treatments / Results  Labs (all labs ordered are listed, but only abnormal results are displayed) Labs Reviewed - No data to display  EKG None  Radiology No results found.  Procedures Procedures (including critical care time)  Medications Ordered in UC Medications - No data to display  Initial Impression / Assessment and Plan / UC Course  I have reviewed the triage vital signs and the nursing notes.  Pertinent labs & imaging results that were available during my care of the patient were reviewed by me and considered in my medical decision making (see chart for details).     I reviewed with the patient home treatments that are available to keep her from coming to the urgent care center. Final Clinical Impressions(s) / UC Diagnoses   Final diagnoses:  Bilateral impacted cerumen     Discharge Instructions     Return as needed Use ear drops  for a few days for ear discomfort    ED Prescriptions    Medication Sig Dispense Auth. Provider   neomycin-polymyxin-hydrocortisone (CORTISPORIN) 3.5-10000-1 OTIC suspension Place 3 drops into the left ear 3 (three) times daily. 10 mL Raylene Everts, MD     Controlled Substance Prescriptions [place] Controlled Substance Registry consulted? Not Applicable   Raylene Everts, MD 09/24/18 [phone]

## 2018-09-24 NOTE — ED Triage Notes (Signed)
Pt presents with left ear fullness with no complaints of pain X 2 days.

## 2018-09-24 NOTE — Discharge Instructions (Addendum)
Return as needed Use ear drops  for a few days for ear discomfort

## 2018-10-11 ENCOUNTER — Telehealth: Payer: Managed Care, Other (non HMO) | Admitting: Physician Assistant

## 2018-10-11 ENCOUNTER — Encounter: Payer: Self-pay | Admitting: Physician Assistant

## 2018-10-11 DIAGNOSIS — H938X9 Other specified disorders of ear, unspecified ear: Secondary | ICD-10-CM

## 2018-10-11 DIAGNOSIS — H9190 Unspecified hearing loss, unspecified ear: Secondary | ICD-10-CM

## 2018-10-11 NOTE — Progress Notes (Signed)
Based on what you shared with me, I feel your condition warrants further evaluation and I recommend that you be seen for a face to face office visit.  NOTE: If you entered your credit card information for this eVisit, you will not be charged. You may see a "hold" on your card for the $35 but that hold will drop off and you will not have a charge processed.   Dana Arroyo,  I recommend following up with your doctor for further management. Workup may include other causes such as Meniere's disease, since you have had ear wax removal of the ear on 2 separate recent occasions and the symptoms are persistent.   If you are having a true medical emergency please call 911.     For an urgent face to face visit, [facility] has five urgent care centers for your convenience:    [email or web address] to reserve your spot online an avoid wait times  [facility] [address], [address] [place], [place] [address] Modified hours of operation: Monday-Friday, 12 PM to 6 PM  Closed Saturday & Sunday  *Across the street from [place] (New Address!) [address], [place], [place] [address] *Just off [address], across the road from [place] hours of operation: Monday-Friday, 12 PM to 6 PM  Closed Saturday & Sunday   The following sites will take your insurance:  . [facility]    [phone]                  Get Driving Directions  [address], [place] [address] . 10 am to 8 pm Monday-Friday . 12 pm to 8 pm Saturday-Sunday   . [facility] at [facility]                  Get Driving Directions  [address], [place], [place] [address] . 8 am to 8 pm Monday-Friday . 9 am to 6 pm Saturday . 11 am to 6 pm Sunday   . [facility] at [place]                  Get Driving Directions   [address] Suite [place], [place] [address] . 8 am to 8 pm Monday-Friday . 8 am to 4 pm Saturday-Sunday    . [facility] at [place]                    Get Driving Directions  [phone]  [address] [place], [place] [address]  . Monday-Friday, 12 PM to 6 PM    Your e-visit answers were reviewed by a board certified advanced clinical practitioner to complete your personal care plan.  Thank you for using e-Visits. I spent 5-10 minutes on review and completion of this note- Lacy Duverney [facility]

## 2018-10-13 ENCOUNTER — Encounter: Payer: Self-pay | Admitting: Family Medicine

## 2018-10-13 ENCOUNTER — Ambulatory Visit (INDEPENDENT_AMBULATORY_CARE_PROVIDER_SITE_OTHER): Payer: Managed Care, Other (non HMO) | Admitting: Family Medicine

## 2018-10-13 ENCOUNTER — Other Ambulatory Visit: Payer: Self-pay

## 2018-10-13 VITALS — BP 138/84 | HR 100 | Temp 98.4°F | Resp 16 | Ht 70.08 in | Wt 175.0 lb

## 2018-10-13 DIAGNOSIS — H6121 Impacted cerumen, right ear: Secondary | ICD-10-CM

## 2018-10-13 DIAGNOSIS — H9190 Unspecified hearing loss, unspecified ear: Secondary | ICD-10-CM

## 2018-10-13 NOTE — Progress Notes (Signed)
Acute Office Visit  Subjective:    Patient ID: Dana Arroyo, female    DOB: 1990-11-15, 28 y.o.   MRN: [id number]  Chief Complaint  Patient presents with  . Ear Pain    right ear pain x 3 days    HPI Patient is in today for ear fullness and mild hearing loss. Pt noted over the past 2 months intermittently "fullness". Pt with cerumen cleared on several occasions and serous otitis treated. Pt states NO PAIN. Pt with no h/o hearing loss or PE tubes in childhood. Pt using ear buds daily to work at home. Another family member at home working and she uses the buds to block the noise. NO swimming currently for exercise. Pt states fullness relieved in the past after cleaning then re-occur. Pt using flonase at night -no antihistamines. Pt does not have eye irritation , cough or sneezing  Past Medical History:  Diagnosis Date  . Arthritis     History reviewed. No pertinent surgical history.  Family History  Problem Relation Age of Onset  . Heart disease Maternal Grandfather   . Cancer Paternal Grandmother     Social History   Socioeconomic History  . Marital status: Single    Spouse name: Not on file  . Number of children: Not on file  . Years of education: Not on file  . Highest education level: Not on file  Occupational History  . Not on file  Social Needs  . Financial resource strain: Not on file  . Food insecurity    Worry: Not on file    Inability: Not on file  . Transportation needs    Medical: Not on file    Non-medical: Not on file  Tobacco Use  . Smoking status: Never Smoker  . Smokeless tobacco: Never Used  Substance and Sexual Activity  . Alcohol use: Yes    Alcohol/week: 1.0 standard drinks    Types: 1 Standard drinks or equivalent per week  . Drug use: No  . Sexual activity: Not on file  Lifestyle  . Physical activity    Days per week: Not on file    Minutes per session: Not on file  . Stress: Not on file  Relationships  . Social Herbalist  on phone: Not on file    Gets together: Not on file    Attends religious service: Not on file    Active member of club or organization: Not on file    Attends meetings of clubs or organizations: Not on file    Relationship status: Not on file  . Intimate partner violence    Fear of current or ex partner: Not on file    Emotionally abused: Not on file    Physically abused: Not on file    Forced sexual activity: Not on file  Other Topics Concern  . Not on file  Social History Narrative  . Not on file    Outpatient Medications Prior to Visit  Medication Sig Dispense Refill  . clobetasol (OLUX) 0.05 % topical foam Apply topically 2 (two) times daily. For 2 weeks. 50 g 0  . etanercept (ENBREL SURECLICK) 50 MG/ML injection Inject 50 mg into the skin once a week.    . fluticasone (FLONASE) 50 MCG/ACT nasal spray Place 2 sprays into both nostrils daily. 16 g 12  . neomycin-polymyxin-hydrocortisone (CORTISPORIN) 3.5-10000-1 OTIC suspension Place 3 drops into the left ear 3 (three) times daily. 10 mL 0   No facility-administered  medications prior to visit.     No Known Allergies  Review of Systems  Constitutional: Negative for fever.  HENT: Positive for hearing loss. Negative for congestion, ear discharge, ear pain, sinus pain, sore throat and tinnitus.   Respiratory: Negative for cough, sputum production and wheezing.   Endo/Heme/Allergies: Negative for environmental allergies.       Objective:    Physical Exam  Constitutional: She appears well-developed and well-nourished. No distress.  HENT:  Head: Normocephalic and atraumatic.  Mouth/Throat: No oropharyngeal exudate.   bilat TM +LR-less on the left vs right. Fluid noted on the left. Cerumen right ear canal-cleared manually with irritation noted on the canal.  BP 138/84   Pulse 100   Temp 98.4 F (36.9 C) (Oral)   Resp 16   Ht 5' 10.08" (1.78 m)   Wt 175 lb (79.4 kg)   LMP 10/11/2018   SpO2 96%   BMI 25.05 kg/m  Wt  Readings from Last 3 Encounters:  10/13/18 175 lb (79.4 kg)  08/04/17 168 lb 12.8 oz (76.6 kg)  06/05/17 172 lb (78 kg)    Health Maintenance Due  Topic Date Due  . HIV Screening  11/20/2003    There are no preventive care reminders to display for this patient.   No results found for: TSH Lab Results  Component Value Date   WBC 6.1 04/10/2017   HGB 13.9 04/10/2017   HCT 41.4 04/10/2017   MCV 89 04/10/2017   PLT 284 04/10/2017   Lab Results  Component Value Date   NA 140 04/10/2017   K 3.9 04/10/2017   CO2 18 (L) 04/10/2017   GLUCOSE 101 (H) 04/10/2017   BUN 9 04/10/2017   CREATININE 0.82 04/10/2017   BILITOT 0.3 04/10/2017   ALKPHOS 66 04/10/2017   AST 13 04/10/2017   ALT 11 04/10/2017   PROT 7.9 04/10/2017   ALBUMIN 4.8 04/10/2017   CALCIUM 9.6 04/10/2017   Lab Results  Component Value Date   CHOL 165 04/10/2017   Lab Results  Component Value Date   HDL 40 04/10/2017   Lab Results  Component Value Date   LDLCALC 103 (H) 04/10/2017   Lab Results  Component Value Date   TRIG 109 04/10/2017   Lab Results  Component Value Date   CHOLHDL 4.1 04/10/2017       Assessment & Plan:   1. Hearing loss, unspecified hearing loss type, unspecified laterality Pt with difficulty x 2 months-seen in UC and primary care for evaluation-no pain-mild hearing loss, cerumen bilat on several occasions - Ambulatory referral to ENT  2. Cerumen debris on tympanic membrane of right ear Cleaned from right canal LISA Mat CarneLEIGH CORUM, MD

## 2018-10-13 NOTE — Patient Instructions (Addendum)
     If you have lab work done today you will be contacted with your lab results within the next 2 weeks.  If you have not heard from [place] then please contact us. The fastest way to get your results is to register for My Chart.   IF you received an x-ray today, you will receive an invoice from [facility] Rad[facility]. Please contact [facility] at [phone] with questions or concerns regarding your invoice.   IF you received labwork today, you will receive an invoice from [facility]. Please contact [facility] at [phone] with questions or concerns regarding your invoice.   Our billing staff will not be able to assist you with questions regarding bills from these companies.  You will be contacted with the lab results as soon as they are available. The fastest way to get your results is to activate your My Chart account. Instructions are located on the last page of this paperwork. If you have not heard from [place] regarding the results in 2 weeks, please contact this office.    Sudafed -pick up at [facility] in the morning daily Continue flonase 2 sprays to both nostrils-bush teeth Use external head phones to avoid irritation of ears bilat ENT referral for additional evaluation

## 2019-01-05 ENCOUNTER — Encounter: Payer: Self-pay | Admitting: Adult Health Nurse Practitioner

## 2019-01-05 ENCOUNTER — Other Ambulatory Visit: Payer: Self-pay

## 2019-01-05 ENCOUNTER — Telehealth (INDEPENDENT_AMBULATORY_CARE_PROVIDER_SITE_OTHER): Payer: Managed Care, Other (non HMO) | Admitting: Adult Health Nurse Practitioner

## 2019-01-05 VITALS — Temp 99.4°F | Ht 70.0 in | Wt 165.0 lb

## 2019-01-05 DIAGNOSIS — R197 Diarrhea, unspecified: Secondary | ICD-10-CM | POA: Diagnosis not present

## 2019-01-05 DIAGNOSIS — R509 Fever, unspecified: Secondary | ICD-10-CM

## 2019-01-05 DIAGNOSIS — Z20822 Contact with and (suspected) exposure to covid-19: Secondary | ICD-10-CM

## 2019-01-05 DIAGNOSIS — R5383 Other fatigue: Secondary | ICD-10-CM | POA: Diagnosis not present

## 2019-01-05 NOTE — Progress Notes (Signed)
Telemedicine Encounter- SOAP NOTE Established Patient  This telephone encounter was conducted with the patient's (or proxy's) verbal consent via audio telecommunications: yes/no: Yes Patient was instructed to have this encounter in a suitably private space; and to only have persons present to whom they give permission to participate. In addition, patient identity was confirmed by use of name plus two identifiers (DOB and address).  I discussed the limitations, risks, security and privacy concerns of performing an evaluation and management service by telephone and the availability of in person appointments. I also discussed with the patient that there may be a patient responsible charge related to this service. The patient expressed understanding and agreed to proceed.  I spent a total of TIME; 0 MIN TO 60 MIN: 15 minutes talking with the patient or their proxy.  No chief complaint on file.   Subjective   Dana Arroyo is a 28 y.o. established patient. Telephone visit today for  HPI   Patient is complaining of fatigue, headaches, diarrhea, fever, and vomiting for the past 5-6 days.  She works from home.  She does not know if she has any exposure and did not eat anything prior to symptoms that seemed not well cooked.  Headaches have not been everyday but started with a frontal headache and nausea with vomiting.  Primarily, the diarrhea and the fatigue or feeling of being drained are her worse symptoms.  She tried Immodium once and it did not improve.  She has not taken anything else for symptomatic relief.  She has been drinking electrolyte enhanced water and trying to stay with a BRAT diet.  She had a fever <101 this morning.     Patient Active Problem List   Diagnosis Date Noted  . Hearing loss 10/13/2018  . Iron deficiency anemia 04/10/2017  . Folliculitis 04/10/2017  . Arthritis 05/09/2016    Past Medical History:  Diagnosis Date  . Arthritis     Current Outpatient Medications   Medication Sig Dispense Refill  . etanercept (ENBREL SURECLICK) 50 MG/ML injection Inject 50 mg into the skin once a week.    . fluticasone (FLONASE) 50 MCG/ACT nasal spray Place 2 sprays into both nostrils daily. (Patient not taking: Reported on 01/05/2019) 16 g 12   No current facility-administered medications for this visit.     No Known Allergies  Social History   Socioeconomic History  . Marital status: Single    Spouse name: Not on file  . Number of children: Not on file  . Years of education: Not on file  . Highest education level: Not on file  Occupational History  . Not on file  Social Needs  . Financial resource strain: Not on file  . Food insecurity    Worry: Not on file    Inability: Not on file  . Transportation needs    Medical: Not on file    Non-medical: Not on file  Tobacco Use  . Smoking status: Never Smoker  . Smokeless tobacco: Never Used  Substance and Sexual Activity  . Alcohol use: Yes    Alcohol/week: 1.0 standard drinks    Types: 1 Standard drinks or equivalent per week  . Drug use: No  . Sexual activity: Not on file  Lifestyle  . Physical activity    Days per week: Not on file    Minutes per session: Not on file  . Stress: Not on file  Relationships  . Social connections    Talks on phone: Not on  file    Gets together: Not on file    Attends religious service: Not on file    Active member of club or organization: Not on file    Attends meetings of clubs or organizations: Not on file    Relationship status: Not on file  . Intimate partner violence    Fear of current or ex partner: Not on file    Emotionally abused: Not on file    Physically abused: Not on file    Forced sexual activity: Not on file  Other Topics Concern  . Not on file  Social History Narrative  . Not on file    Review of Systems  Constitutional: Positive for fever and malaise/fatigue. Negative for chills and diaphoresis.  Respiratory: Negative for cough and  shortness of breath.   Gastrointestinal: Positive for abdominal pain, diarrhea, nausea and vomiting.  Genitourinary: Negative.   Musculoskeletal: Positive for myalgias.  Neurological: Positive for headaches. Negative for sensory change and speech change.  Psychiatric/Behavioral: Negative.     Objective     GEN:  NAD,  Alert & Oriented x 3 PSYCH: Normally interactive. Conversant. Not depressed or anxious appearing.  Calm demeanor.    Vitals as reported by the patient: Today's Vitals   01/05/19 1020  Temp: 99.4 F (37.4 C)  TempSrc: Temporal  Weight: 165 lb (74.8 kg)  Height: 5\' 10"  (1.778 m)    Diagnoses and all orders for this visit:  Diarrhea, unspecified type -     Novel Coronavirus, NAA ([facility])  Fatigue, unspecified type -     Novel Coronavirus, NAA ([facility])  Fever, unspecified fever cause -     Novel Coronavirus, NAA ([facility])   Given her symptoms of fatigue, headaches, fever, and diarrhea, it would not be unreasonable to get a Covid test.  I have placed the order and given her information on where to go.  In the interim, I would like her to try Pepto Bismol, continue fluids, and monitor temp.  Should symptoms progress or worsen, she will call or return.  Patient is satisfied with plan.   I discussed the assessment and treatment plan with the patient. The patient was provided an opportunity to ask questions and all were answered. The patient agreed with the plan and demonstrated an understanding of the instructions.   The patient was advised to call back or seek an in-person evaluation if the symptoms worsen or if the condition fails to improve as anticipated.  I provided 15 minutes of non-face-to-face time during this encounter.  Glyn Ade, NP  Primary Care at [facility]

## 2019-01-05 NOTE — Progress Notes (Signed)
Flu shot 9/26  Stated sx on 9/30 Denies pain and cramping  -diarrhea  -nausea -Headaches  -Vomiting -happen one day

## 2019-01-05 NOTE — Patient Instructions (Addendum)
   If you have lab work done today you will be contacted with your lab results within the next 2 weeks.  If you have not heard from us then please contact us. The fastest way to get your results is to register for My Chart.   IF you received an x-ray today, you will receive an invoice from [facility]. Please contact [facility] at [phone] with questions or concerns regarding your invoice.   IF you received labwork today, you will receive an invoice from [facility]. Please contact [facility] at [phone] with questions or concerns regarding your invoice.   Our billing staff will not be able to assist you with questions regarding bills from these companies.  You will be contacted with the lab results as soon as they are available. The fastest way to get your results is to activate your My Chart account. Instructions are located on the last page of this paperwork. If you have not heard from us regarding the results in 2 weeks, please contact this office.     Food Choices to Help Relieve Diarrhea, Adult When you have diarrhea, the foods you eat and your eating habits are very important. Choosing the right foods and drinks can help:  Relieve diarrhea.  Replace lost fluids and nutrients.  Prevent dehydration. What general guidelines should I follow?  Relieving diarrhea  Choose foods with less than 2 g or .07 oz. of fiber per serving.  Limit fats to less than 8 tsp (38 g or 1.34 oz.) a day.  Avoid the following: ? Foods and beverages sweetened with high-fructose corn syrup, honey, or sugar alcohols such as xylitol, sorbitol, and mannitol. ? Foods that contain a lot of fat or sugar. ? Fried, greasy, or spicy foods. ? High-fiber grains, breads, and cereals. ? Raw fruits and vegetables.  Eat foods that are rich in probiotics. These foods include dairy products such as yogurt and fermented milk products. They help increase healthy bacteria in the stomach and  intestines (gastrointestinal tract, or GI tract).  If you have lactose intolerance, avoid dairy products. These may make your diarrhea worse.  Take medicine to help stop diarrhea (antidiarrheal medicine) only as told by your health care provider. Replacing nutrients  Eat small meals or snacks every 3-4 hours.  Eat bland foods, such as white rice, toast, or baked potato, until your diarrhea starts to get better. Gradually reintroduce nutrient-rich foods as tolerated or as told by your health care provider. This includes: ? Well-cooked protein foods. ? Peeled, seeded, and soft-cooked fruits and vegetables. ? Low-fat dairy products.  Take vitamin and mineral supplements as told by your health care provider. Preventing dehydration  Start by sipping water or a special solution to prevent dehydration (oral rehydration solution, ORS). Urine that is clear or pale yellow means that you are getting enough fluid.  Try to drink at least 8-10 cups of fluid each day to help replace lost fluids.  You may add other liquids in addition to water, such as clear juice or decaffeinated sports drinks, as tolerated or as told by your health care provider.  Avoid drinks with caffeine, such as coffee, tea, or soft drinks.  Avoid alcohol. What foods are recommended?     The items listed may not be a complete list. Talk with your health care provider about what dietary choices are best for you. Grains White rice. White, French, or pita breads (fresh or toasted), including plain rolls, buns, or bagels. White pasta. Saltine, soda,   or graham crackers. Pretzels. Low-fiber cereal. Cooked cereals made with water (such as cornmeal, farina, or cream cereals). Plain muffins. Matzo. Melba toast. Zwieback. Vegetables Potatoes (without the skin). Most well-cooked and canned vegetables without skins or seeds. Tender lettuce. Fruits Apple sauce. Fruits canned in juice. Cooked apricots, cherries, grapefruit, peaches,  pears, or plums. Fresh bananas and cantaloupe. Meats and other protein foods Baked or boiled chicken. Eggs. Tofu. Fish. Seafood. Smooth nut butters. Ground or well-cooked tender beef, ham, veal, lamb, pork, or poultry. Dairy Plain yogurt, kefir, and unsweetened liquid yogurt. Lactose-free milk, buttermilk, skim milk, or soy milk. Low-fat or nonfat hard cheese. Beverages Water. Low-calorie sports drinks. Fruit juices without pulp. Strained tomato and vegetable juices. Decaffeinated teas. Sugar-free beverages not sweetened with sugar alcohols. Oral rehydration solutions, if approved by your health care provider. Seasoning and other foods Bouillon, broth, or soups made from recommended foods. What foods are not recommended? The items listed may not be a complete list. Talk with your health care provider about what dietary choices are best for you. Grains Whole grain, whole wheat, bran, or rye breads, rolls, pastas, and crackers. Wild or brown rice. Whole grain or bran cereals. Barley. Oats and oatmeal. Corn tortillas or taco shells. Granola. Popcorn. Vegetables Raw vegetables. Fried vegetables. Cabbage, broccoli, Brussels sprouts, artichokes, baked beans, beet greens, corn, kale, legumes, peas, sweet potatoes, and yams. Potato skins. Cooked spinach and cabbage. Fruits Dried fruit, including raisins and dates. Raw fruits. Stewed or dried prunes. Canned fruits with syrup. Meat and other protein foods Fried or fatty meats. Deli meats. Chunky nut butters. Nuts and seeds. Beans and lentils. Bacon. Hot dogs. Sausage. Dairy High-fat cheeses. Whole milk, chocolate milk, and beverages made with milk, such as milk shakes. Half-and-half. Cream. sour cream. Ice cream. Beverages Caffeinated beverages (such as coffee, tea, soda, or energy drinks). Alcoholic beverages. Fruit juices with pulp. Prune juice. Soft drinks sweetened with high-fructose corn syrup or sugar alcohols. High-calorie sports drinks. Fats and  oils Butter. Cream sauces. Margarine. Salad oils. Plain salad dressings. Olives. Avocados. Mayonnaise. Sweets and desserts Sweet rolls, doughnuts, and sweet breads. Sugar-free desserts sweetened with sugar alcohols such as xylitol and sorbitol. Seasoning and other foods Honey. Hot sauce. Chili powder. Gravy. Cream-based or milk-based soups. Pancakes and waffles. Summary  When you have diarrhea, the foods you eat and your eating habits are very important.  Make sure you get at least 8-10 cups of fluid each day, or enough to keep your urine clear or pale yellow.  Eat bland foods and gradually reintroduce healthy, nutrient-rich foods as tolerated, or as told by your health care provider.  Avoid high-fiber, fried, greasy, or spicy foods. This information is not intended to replace advice given to you by your health care provider. Make sure you discuss any questions you have with your health care provider. Document Released: 06/07/2003 Document Revised: 07/08/2018 Document Reviewed: 03/14/2016 Elsevier Patient Education  2020 Elsevier Inc.  

## 2019-01-06 ENCOUNTER — Telehealth: Payer: Managed Care, Other (non HMO) | Admitting: Adult Health Nurse Practitioner

## 2019-01-07 ENCOUNTER — Telehealth: Payer: Managed Care, Other (non HMO) | Admitting: Nurse Practitioner

## 2019-01-07 DIAGNOSIS — R197 Diarrhea, unspecified: Secondary | ICD-10-CM

## 2019-01-07 LAB — NOVEL CORONAVIRUS, NAA: SARS-CoV-2, NAA: NOT DETECTED

## 2019-01-07 NOTE — Progress Notes (Signed)
Based on what you shared with me, I feel your condition warrants further evaluation and I recommend that you be seen for a face to face visit.  Please contact your primary care physician practice to be seen. Many offices offer virtual options to be seen via video if you are not comfortable going in person to a medical facility at this time.  If you do not have a PCP,  offers a free physician referral service available at [phone]. Our trained staff has the experience, knowledge and resources to put you in touch with a physician who is right for you.   You also have the option of a video visit through [email or web address]  If you are having a true medical emergency please call 911.  NOTE: If you entered your credit card information for this eVisit, you will not be charged. You may see a "hold" on your card for the $35 but that hold will drop off and you will not have a charge processed.  Your e-visit answers were reviewed by a board certified advanced clinical practitioner to complete your personal care plan.  Thank you for using e-Visits.  I have spent approximately 5 minutes reviewing and documenting in the patient's chart.

## 2019-06-01 ENCOUNTER — Other Ambulatory Visit: Payer: Self-pay

## 2019-06-01 ENCOUNTER — Encounter (INDEPENDENT_AMBULATORY_CARE_PROVIDER_SITE_OTHER): Payer: Self-pay | Admitting: Otolaryngology

## 2019-06-01 ENCOUNTER — Ambulatory Visit (INDEPENDENT_AMBULATORY_CARE_PROVIDER_SITE_OTHER): Payer: Managed Care, Other (non HMO) | Admitting: Otolaryngology

## 2019-06-01 VITALS — Temp 97.7°F

## 2019-06-01 DIAGNOSIS — H6123 Impacted cerumen, bilateral: Secondary | ICD-10-CM | POA: Diagnosis not present

## 2019-06-01 NOTE — Progress Notes (Signed)
HPI: Dana Arroyo is a 29 y.o. female who presents for evaluation of cerumen buildup with decreased hearing in the left ear.  She had her ears last cleaned in September of last year.  She has blockage in the left ear presently.  No pain or drainage..  Past Medical History:  Diagnosis Date  . Arthritis    No past surgical history on file. Social History   Socioeconomic History  . Marital status: Single    Spouse name: Not on file  . Number of children: Not on file  . Years of education: Not on file  . Highest education level: Not on file  Occupational History  . Not on file  Tobacco Use  . Smoking status: Never Smoker  . Smokeless tobacco: Never Used  Substance and Sexual Activity  . Alcohol use: Yes    Alcohol/week: 1.0 standard drinks    Types: 1 Standard drinks or equivalent per week  . Drug use: No  . Sexual activity: Not on file  Other Topics Concern  . Not on file  Social History Narrative  . Not on file   Social Determinants of Health   Financial Resource Strain:   . Difficulty of Paying Living Expenses: Not on file  Food Insecurity:   . Worried About Charity fundraiser in the Last Year: Not on file  . Ran Out of Food in the Last Year: Not on file  Transportation Needs:   . Lack of Transportation (Medical): Not on file  . Lack of Transportation (Non-Medical): Not on file  Physical Activity:   . Days of Exercise per Week: Not on file  . Minutes of Exercise per Session: Not on file  Stress:   . Feeling of Stress : Not on file  Social Connections:   . Frequency of Communication with Friends and Family: Not on file  . Frequency of Social Gatherings with Friends and Family: Not on file  . Attends Religious Services: Not on file  . Active Member of Clubs or Organizations: Not on file  . Attends Archivist Meetings: Not on file  . Marital Status: Not on file   Family History  Problem Relation Age of Onset  . Heart disease Maternal Grandfather   .  Cancer Paternal Grandmother    No Known Allergies Prior to Admission medications   Medication Sig Start Date End Date Taking? Authorizing Provider  etanercept (ENBREL SURECLICK) 50 MG/ML injection Inject 50 mg into the skin once a week.   Yes [provider]  fluticasone (FLONASE) 50 MCG/ACT nasal spray Place 2 sprays into both nostrils daily. 08/17/18  Yes Robyn Haber, MD     Positive ROS: Otherwise negative  All other systems have been reviewed and were otherwise negative with the exception of those mentioned in the HPI and as above.  Physical Exam: Constitutional: Alert, well-appearing, no acute distress Ears: External ears without lesions or tenderness. Ear canals mild amount of cerumen in both sides left side worse than right this was cleaned with suction and curettes.  As well as forceps.  TMs were clear bilaterally. Nasal: External nose without lesions. Clear nasal passages Oral: Oropharynx clear. Neck: No palpable adenopathy or masses Respiratory: Breathing comfortably  Skin: No facial/neck lesions or rash noted.  Cerumen impaction removal  Date/Time: 06/01/2019 9:15 AM Performed by: Rozetta Nunnery, MD Authorized by: Rozetta Nunnery, MD   Consent:    Consent obtained:  Verbal   Consent given by:  Patient  Risks discussed:  Pain and bleeding Procedure details:    Location:  L ear and R ear   Procedure type: curette, suction and forceps   Post-procedure details:    Inspection:  TM intact and canal normal   Hearing quality:  Improved   Patient tolerance of procedure:  Tolerated well, no immediate complications Comments:     TMs are clear bilaterally.    Assessment: Bilateral cerumen impactions  Plan: She will follow-up as needed  Narda Bonds, MD

## 2019-10-18 ENCOUNTER — Other Ambulatory Visit: Payer: Self-pay

## 2019-10-18 ENCOUNTER — Ambulatory Visit (INDEPENDENT_AMBULATORY_CARE_PROVIDER_SITE_OTHER): Payer: Managed Care, Other (non HMO) | Admitting: Otolaryngology

## 2019-10-18 ENCOUNTER — Encounter (INDEPENDENT_AMBULATORY_CARE_PROVIDER_SITE_OTHER): Payer: Self-pay | Admitting: Otolaryngology

## 2019-10-18 VITALS — Temp 97.5°F

## 2019-10-18 DIAGNOSIS — H6123 Impacted cerumen, bilateral: Secondary | ICD-10-CM | POA: Diagnosis not present

## 2019-10-18 NOTE — Progress Notes (Signed)
HPI: Dana Arroyo is a 29 y.o. female who presents for evaluation of wax buildup.  Ears were last cleaned in March.  She has blockage again but mild..  Past Medical History:  Diagnosis Date  . Arthritis    No past surgical history on file. Social History   Socioeconomic History  . Marital status: Single    Spouse name: Not on file  . Number of children: Not on file  . Years of education: Not on file  . Highest education level: Not on file  Occupational History  . Not on file  Tobacco Use  . Smoking status: Never Smoker  . Smokeless tobacco: Never Used  Substance and Sexual Activity  . Alcohol use: Yes    Alcohol/week: 1.0 standard drink    Types: 1 Standard drinks or equivalent per week  . Drug use: No  . Sexual activity: Not on file  Other Topics Concern  . Not on file  Social History Narrative  . Not on file   Social Determinants of Health   Financial Resource Strain:   . Difficulty of Paying Living Expenses:   Food Insecurity:   . Worried About Programme researcher, broadcasting/film/video in the Last Year:   . Barista in the Last Year:   Transportation Needs:   . Freight forwarder (Medical):   Marland Kitchen Lack of Transportation (Non-Medical):   Physical Activity:   . Days of Exercise per Week:   . Minutes of Exercise per Session:   Stress:   . Feeling of Stress :   Social Connections:   . Frequency of Communication with Friends and Family:   . Frequency of Social Gatherings with Friends and Family:   . Attends Religious Services:   . Active Member of Clubs or Organizations:   . Attends Banker Meetings:   Marland Kitchen Marital Status:    Family History  Problem Relation Age of Onset  . Heart disease Maternal Grandfather   . Cancer Paternal Grandmother    No Known Allergies Prior to Admission medications   Medication Sig Start Date End Date Taking? Authorizing Provider  etanercept (ENBREL SURECLICK) 50 MG/ML injection Inject 50 mg into the skin once a week.   Yes [provider]  fluticasone (FLONASE) 50 MCG/ACT nasal spray Place 2 sprays into both nostrils daily. Patient not taking: Reported on 10/18/2019 08/17/18   Elvina Sidle, MD     Positive ROS: Otherwise negative  All other systems have been reviewed and were otherwise negative with the exception of those mentioned in the HPI and as above.  Physical Exam: Constitutional: Alert, well-appearing, no acute distress Ears: External ears without lesions or tenderness. Ear canals with moderate wax buildup in both sides that was cleaned with curette.  TMs were clear bilaterally.. Nasal: External nose without lesions. Clear nasal passages Oral: Oropharynx clear. Neck: No palpable adenopathy or masses Respiratory: Breathing comfortably  Skin: No facial/neck lesions or rash noted.  Cerumen impaction removal  Date/Time: 10/18/2019 4:07 PM Performed by: Drema Halon, MD Authorized by: Drema Halon, MD   Consent:    Consent obtained:  Verbal   Consent given by:  Patient   Risks discussed:  Pain and bleeding Procedure details:    Location:  L ear and R ear   Procedure type: curette   Post-procedure details:    Inspection:  TM intact and canal normal   Hearing quality:  Improved   Patient tolerance of procedure:  Tolerated well, no  immediate complications Comments:     TMs are clear bilaterally.    Assessment: Wax buildup  Plan: She will follow-up in approximately 6 months or as needed.  Narda Bonds, MD

## 2020-01-10 ENCOUNTER — Ambulatory Visit
Admission: RE | Admit: 2020-01-10 | Discharge: 2020-01-10 | Disposition: A | Discharge status: Home / Self Care | Payer: Managed Care, Other (non HMO) | Source: Ambulatory Visit | Attending: Family Medicine | Admitting: Family Medicine

## 2020-01-10 ENCOUNTER — Other Ambulatory Visit: Payer: Self-pay

## 2020-01-10 VITALS — BP 111/74 | HR 93 | Temp 97.8°F | Resp 18

## 2020-01-10 DIAGNOSIS — R110 Nausea: Secondary | ICD-10-CM

## 2020-01-10 DIAGNOSIS — F419 Anxiety disorder, unspecified: Secondary | ICD-10-CM

## 2020-01-10 MED ORDER — ONDANSETRON 4 MG PO TBDP
4.0000 mg | ORAL_TABLET | Freq: Once | ORAL | Status: AC
  Administered 2020-01-10: 4 mg via ORAL

## 2020-01-10 MED ORDER — ONDANSETRON 8 MG PO TBDP: 8.0000 mg | ORAL_TABLET | Freq: Three times a day (TID) | ORAL | 0 refills | Status: AC | PRN

## 2020-01-10 NOTE — ED Provider Notes (Signed)
[facility]    CSN: [id number] Arrival date & time: 01/10/20  1753      History   Chief Complaint Chief Complaint  Patient presents with  . appt 6-nausea    HPI Dana Arroyo is a 29 y.o. female.   HPI  Patient presents for evaluation of nausea x4 days.  Patient reports that she received her influenza vaccine 4 days ago and since that time she has had repetitive nausea, feeling anxious and panicky without vomiting.  She has no other abdominal symptoms, URI symptoms, or fever.  She has tolerated the flu vaccine in the past.  She is currently immunosuppressed due to has taken Enbrel and wonders if this had anything to do with her reaction this time.  She has been unable to eat due to the intensity of the nausea.  She has not taken any antiemetics since symptoms onset. Past Medical History:  Diagnosis Date  . Arthritis     Patient Active Problem List   Diagnosis Date Noted  . Hearing loss 10/13/2018  . Iron deficiency anemia 04/10/2017  . Folliculitis 04/10/2017  . Arthritis 05/09/2016    History reviewed. No pertinent surgical history.  OB History   No obstetric history on file.      Home Medications    Prior to Admission medications   Medication Sig Start Date End Date Taking? Authorizing Provider  etanercept (ENBREL SURECLICK) 50 MG/ML injection Inject 50 mg into the skin once a week.    [provider]    Family History Family History  Problem Relation Age of Onset  . Heart disease Maternal Grandfather   . Cancer Paternal Grandmother     Social History Social History   Tobacco Use  . Smoking status: Never Smoker  . Smokeless tobacco: Never Used  Substance Use Topics  . Alcohol use: Yes    Alcohol/week: 1.0 standard drink    Types: 1 Standard drinks or equivalent per week  . Drug use: No     Allergies   Patient has no known allergies.  Review of Systems Review of Systems Pertinent negatives listed in HPI  Physical  Exam Triage Vital Signs ED Triage Vitals [01/10/20 1812]  Enc Vitals Group     BP 111/74     Pulse Rate 93     Resp 18     Temp 97.8 F (36.6 C)     Temp Source Oral     SpO2 98 %     Weight      Height      Head Circumference      Peak Flow      Pain Score 0     Pain Loc      Pain Edu?      Excl. in GC?    No data found.  Updated Vital Signs BP 111/74 (BP Location: Left Arm)   Pulse 93   Temp 97.8 F (36.6 C) (Oral)   Resp 18   LMP 12/12/2019   SpO2 98%   Visual Acuity Right Eye Distance:   Left Eye Distance:   Bilateral Distance:    Right Eye Near:   Left Eye Near:    Bilateral Near:     Physical Exam General appearance: alert, well developed, well nourished, cooperative and in no distress Head: Normocephalic, without obvious abnormality, atraumatic Respiratory: Respirations even and unlabored, normal respiratory rate Heart: rate and rhythm normal. No gallop or murmurs noted on exam  Abdomen: BS +, no distention, no  rebound tenderness, or no mass Extremities: No gross deformities Skin: Skin color, texture, turgor normal. No rashes seen  Psych: Anxious  and affect.   UC Treatments / Results  Labs (all labs ordered are listed, but only abnormal results are displayed) Labs Reviewed - No data to display  EKG   Radiology No results found.  Procedures Procedures (including critical care time)  Medications Ordered in UC Medications - No data to display  Initial Impression / Assessment and Plan / UC Course  I have reviewed the triage vital signs and the nursing notes.  Pertinent labs & imaging results that were available during my care of the patient were reviewed by me and considered in my medical decision making (see chart for details).    Patient with acute nausea without vomiting causing secondary anxiety.  Exam unremarkable.  Will trial Zofran as needed.  Patient advised advance diet as tolerated.  Unable to ascertain whether or not this may or  may not be related to recent influenza vaccine.  Continue to monitor symptoms follow-up with primary care provider if symptoms worsen or do not improve. Final Clinical Impressions(s) / UC Diagnoses   Final diagnoses:  Nausea without vomiting  Anxiousness     Discharge Instructions     Advance diet as tolerated.  Would recommend initially starting off with a bland diet since she has had nausea for so many days.  I have attached a list of bland type foods that she can advance as tolerated.  Take nausea medicine at least 1 hour prior to eating.  If symptoms worsen or do not improve follow-up with your primary care provider.    ED Prescriptions    Medication Sig Dispense Auth. Provider   ondansetron (ZOFRAN-ODT) 8 MG disintegrating tablet Take 1 tablet (8 mg total) by mouth every 8 (eight) hours as needed for nausea. 30 tablet Bing Neighbors, FNP     PDMP not reviewed this encounter.   Bing Neighbors, FNP 01/10/20 1850

## 2020-01-10 NOTE — Discharge Instructions (Signed)
Advance diet as tolerated.  Would recommend initially starting off with a bland diet since she has had nausea for so many days.  I have attached a list of bland type foods that she can advance as tolerated.  Take nausea medicine at least 1 hour prior to eating.  If symptoms worsen or do not improve follow-up with your primary care provider.

## 2020-01-10 NOTE — ED Triage Notes (Signed)
Pt states received her flu shot on Saturday and has felt nauseous, decreased appetite, and anxious since. States on Saturday had feeling of acid reflux that is now nausea.
